# Patient Record
Sex: Female | Born: 1955 | Race: White | Hispanic: No | Marital: Single | Smoking: Former smoker
Health system: Southern US, Community
[De-identification: ages and names within clinical notes are randomized; demographics above are authoritative.]

## PROBLEM LIST (undated history)

## (undated) DIAGNOSIS — Z860100 Personal history of colon polyps, unspecified: Secondary | ICD-10-CM

## (undated) DIAGNOSIS — I10 Essential (primary) hypertension: Secondary | ICD-10-CM

## (undated) DIAGNOSIS — R945 Abnormal results of liver function studies: Secondary | ICD-10-CM

## (undated) DIAGNOSIS — Z87898 Personal history of other specified conditions: Secondary | ICD-10-CM

## (undated) DIAGNOSIS — Z853 Personal history of malignant neoplasm of breast: Secondary | ICD-10-CM

## (undated) HISTORY — DX: Personal history of other specified conditions: Z87.898

## (undated) HISTORY — DX: Personal history of colon polyps, unspecified: Z86.0100

## (undated) HISTORY — DX: Personal history of colonic polyps: Z86.010

## (undated) HISTORY — DX: Abnormal results of liver function studies: R94.5

## (undated) HISTORY — DX: Essential (primary) hypertension: I10

## (undated) HISTORY — DX: Personal history of malignant neoplasm of breast: Z85.3

## (undated) HISTORY — PX: BREAST LUMPECTOMY: SHX2

---

## 1986-11-14 HISTORY — PX: CERVICAL CONE BIOPSY: SUR198

## 2004-07-07 ENCOUNTER — Other Ambulatory Visit: Admission: RE | Admit: 2004-07-07 | Discharge: 2004-07-07 | Payer: Self-pay | Admitting: *Deleted

## 2004-10-26 ENCOUNTER — Encounter: Admission: RE | Admit: 2004-10-26 | Discharge: 2004-10-26 | Payer: Self-pay | Admitting: Family Medicine

## 2005-08-08 ENCOUNTER — Other Ambulatory Visit: Admission: RE | Admit: 2005-08-08 | Discharge: 2005-08-08 | Payer: Self-pay | Admitting: *Deleted

## 2005-10-27 ENCOUNTER — Encounter: Admission: RE | Admit: 2005-10-27 | Discharge: 2005-10-27 | Payer: Self-pay | Admitting: Family Medicine

## 2006-10-19 ENCOUNTER — Other Ambulatory Visit: Admission: RE | Admit: 2006-10-19 | Discharge: 2006-10-19 | Payer: Self-pay | Admitting: Obstetrics & Gynecology

## 2006-10-30 ENCOUNTER — Encounter: Admission: RE | Admit: 2006-10-30 | Discharge: 2006-10-30 | Payer: Self-pay | Admitting: Family Medicine

## 2006-10-30 IMAGING — MG MM DIGITAL SCREENING BILAT W/ CAD
4 series · 4 of 4 positions shown · non-contrast
Comparison: none

DG SCREEN MAMMOGRAM BILATERAL
Bilateral CC and MLO view(s) were taken.

DIGITAL SCREENING MAMMOGRAM WITH CAD:
The breast tissue is heterogeneously dense.  Microcalcifications are present in the left breast.  
Characterization with magnification views is recommended.  No mass or malignant type calcifications
are identified in the right breast.  Compared with prior studies.

[R CC]
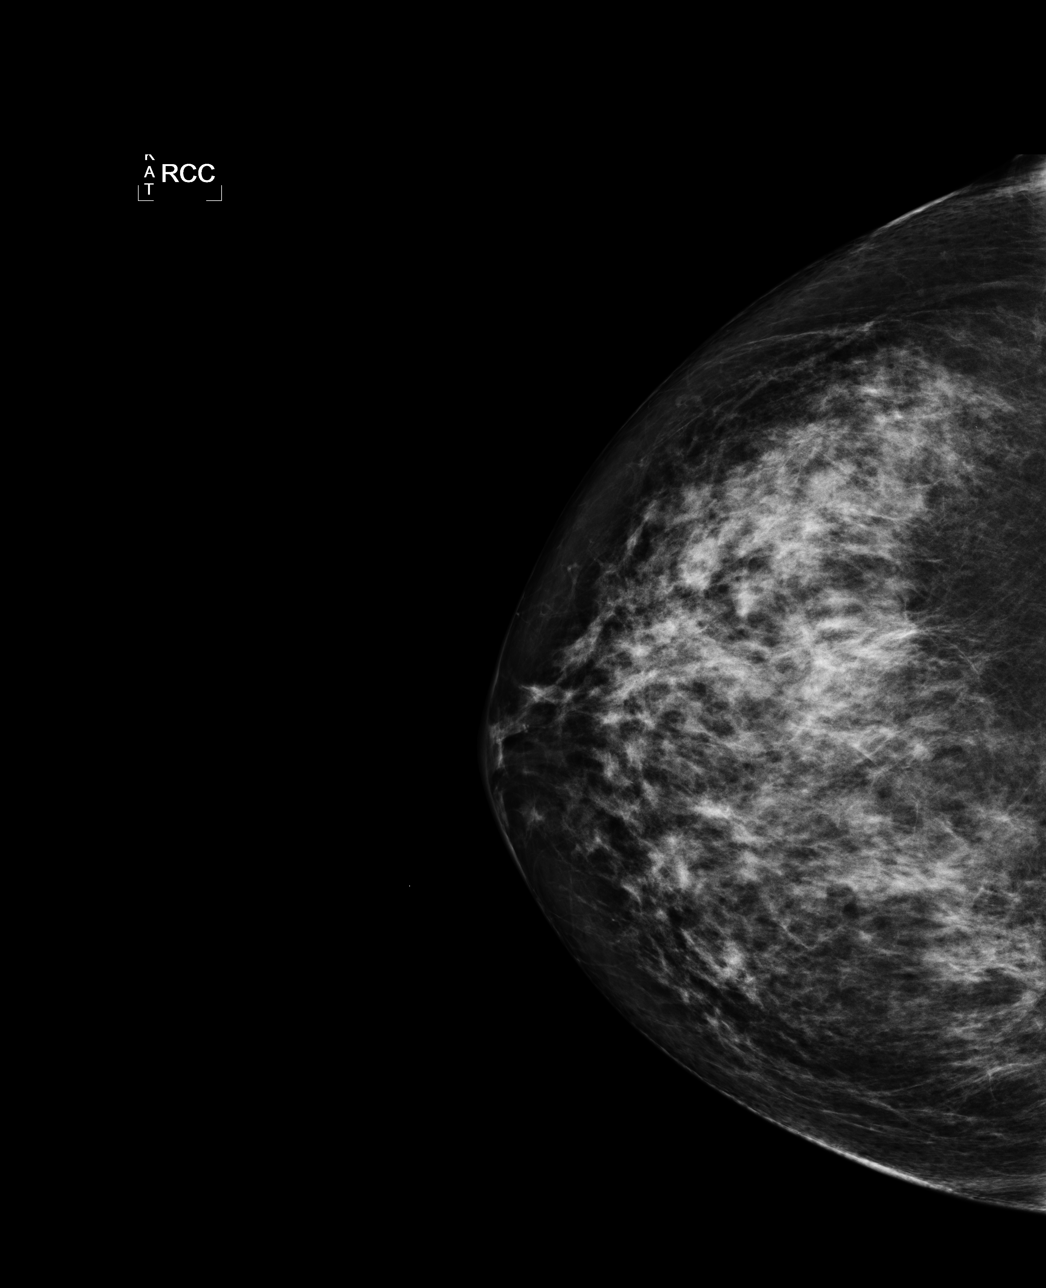

[L CC]
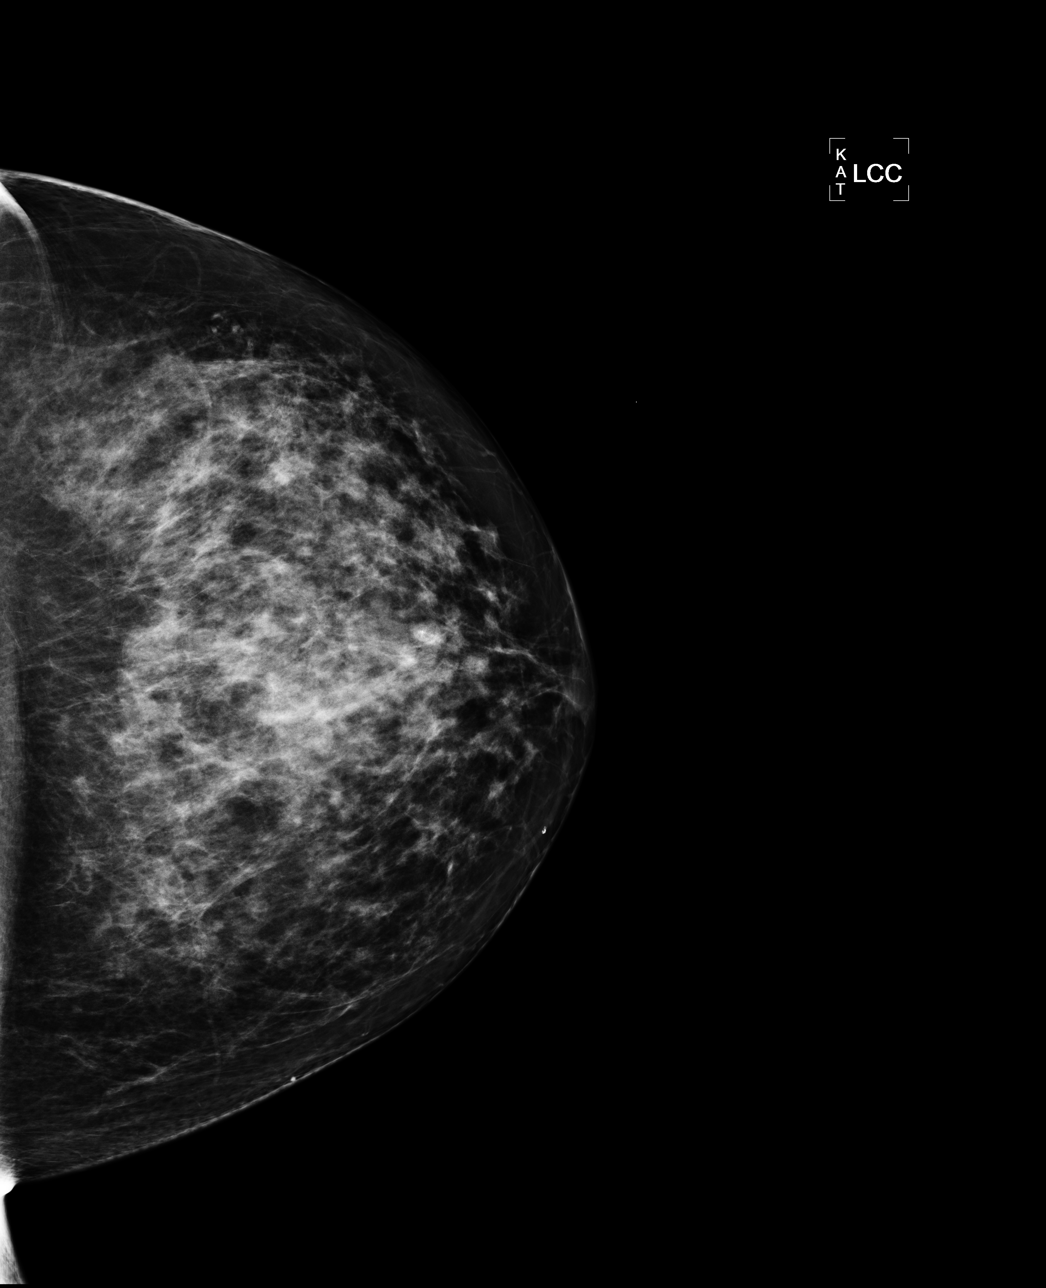

[L MLO]
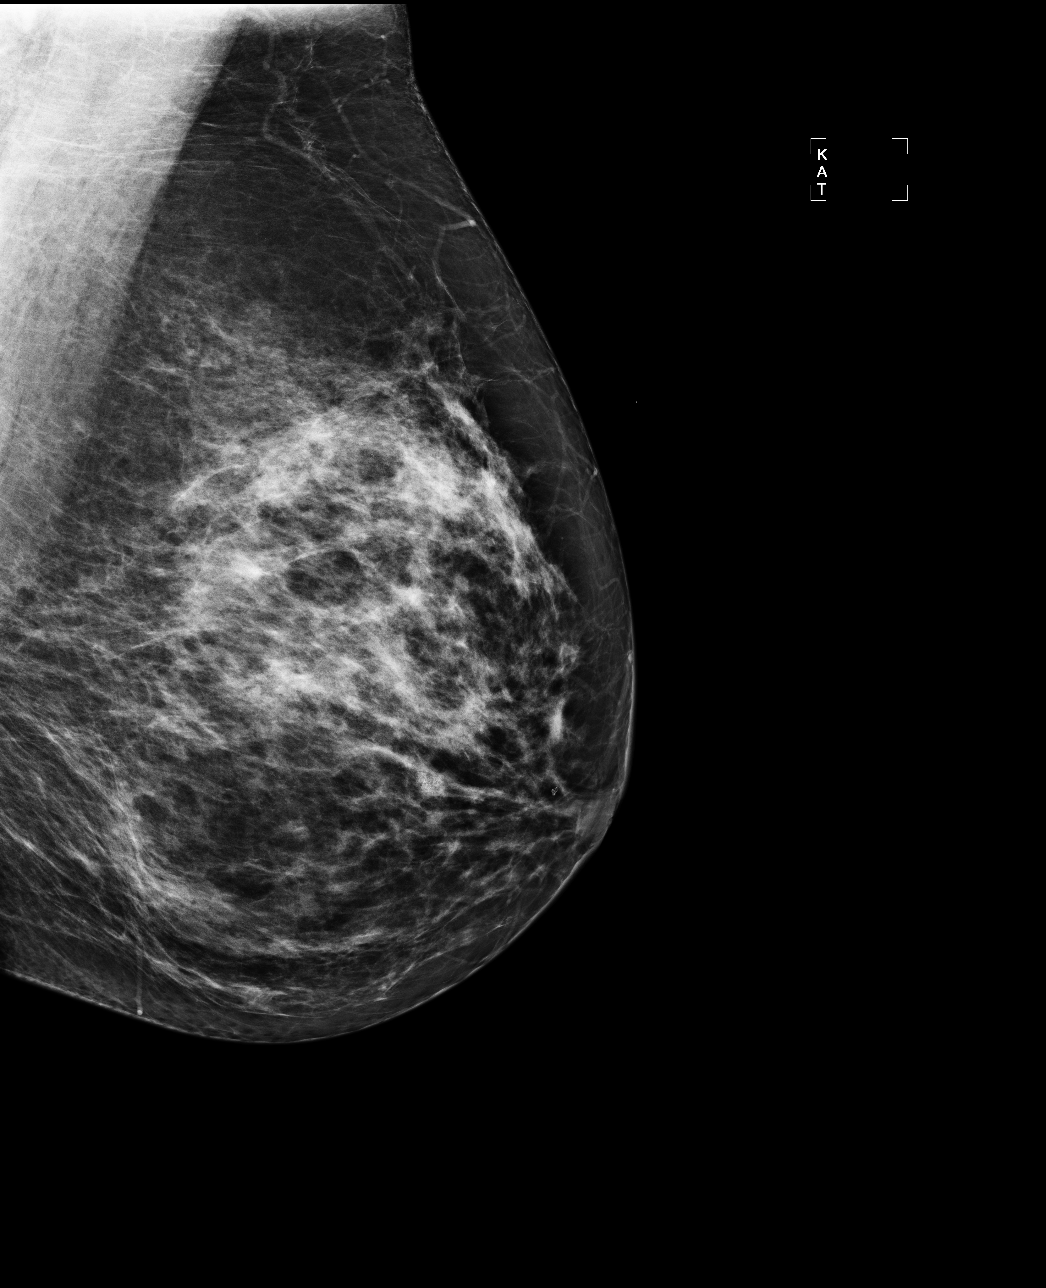

[R MLO]
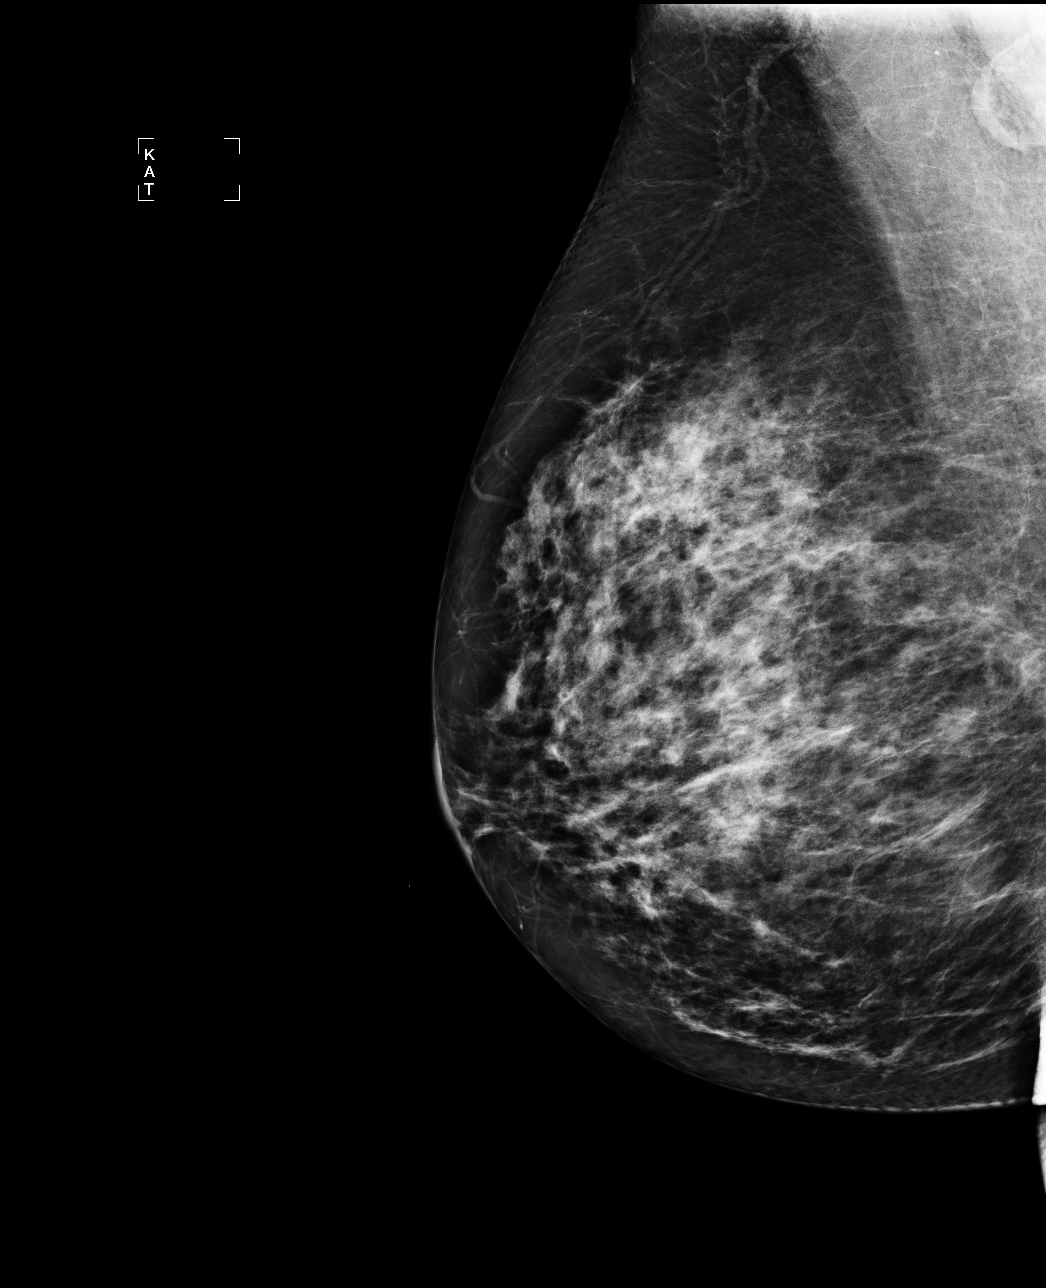

[4 of 4 positions shown; findings below may reference images not displayed]

IMPRESSION: Calcifications, left breast.  Additional evaluation is indicated. The patient will be contacted for
additional studies and a supplementary report will follow.  No specific mammographic evidence of 
malignancy, right breast.

ASSESSMENT: Need additional imaging evaluation and/or prior mammograms for comparison - BI-RADS 0

Further imaging of the left breast.
ANALYZED BY COMPUTER AIDED DETECTION. , THIS PROCEDURE WAS A DIGITAL MAMMOGRAM.

## 2006-11-10 ENCOUNTER — Encounter (INDEPENDENT_AMBULATORY_CARE_PROVIDER_SITE_OTHER): Payer: Self-pay | Admitting: Specialist

## 2006-11-10 ENCOUNTER — Encounter: Admission: RE | Admit: 2006-11-10 | Discharge: 2006-11-10 | Payer: Self-pay | Admitting: Family Medicine

## 2006-12-04 ENCOUNTER — Encounter: Admission: RE | Admit: 2006-12-04 | Discharge: 2006-12-04 | Payer: Self-pay | Admitting: Family Medicine

## 2007-02-22 ENCOUNTER — Ambulatory Visit: Payer: Self-pay | Admitting: Oncology

## 2007-02-28 LAB — CBC WITH DIFFERENTIAL/PLATELET
Basophils Absolute: 0 10*3/uL (ref 0.0–0.1)
EOS%: 1.6 % (ref 0.0–7.0)
HCT: 41.3 % (ref 34.8–46.6)
HGB: 14.7 g/dL (ref 11.6–15.9)
LYMPH%: 40.7 % (ref 14.0–48.0)
MCH: 31 pg (ref 26.0–34.0)
MCHC: 35.5 g/dL (ref 32.0–36.0)
MCV: 87.4 fL (ref 81.0–101.0)
MONO%: 7.2 % (ref 0.0–13.0)
NEUT%: 49.8 % (ref 39.6–76.8)
Platelets: 230 10*3/uL (ref 145–400)

## 2007-02-28 LAB — COMPREHENSIVE METABOLIC PANEL
AST: 22 U/L (ref 0–37)
Albumin: 4.2 g/dL (ref 3.5–5.2)
Alkaline Phosphatase: 76 U/L (ref 39–117)
Potassium: 3.8 mEq/L (ref 3.5–5.3)
Sodium: 142 mEq/L (ref 135–145)
Total Bilirubin: 0.6 mg/dL (ref 0.3–1.2)
Total Protein: 6.6 g/dL (ref 6.0–8.3)

## 2007-03-01 ENCOUNTER — Ambulatory Visit: Admission: RE | Admit: 2007-03-01 | Discharge: 2007-05-16 | Payer: Self-pay | Admitting: Radiation Oncology

## 2007-04-26 ENCOUNTER — Ambulatory Visit: Payer: Self-pay | Admitting: Oncology

## 2007-07-30 ENCOUNTER — Ambulatory Visit: Payer: Self-pay | Admitting: Oncology

## 2007-09-13 ENCOUNTER — Ambulatory Visit: Payer: Self-pay | Admitting: Internal Medicine

## 2007-09-14 ENCOUNTER — Ambulatory Visit: Payer: Self-pay | Admitting: Oncology

## 2007-09-18 LAB — CBC WITH DIFFERENTIAL/PLATELET
Basophils Absolute: 0 10*3/uL (ref 0.0–0.1)
EOS%: 1.8 % (ref 0.0–7.0)
LYMPH%: 37.9 % (ref 14.0–48.0)
MCH: 31.3 pg (ref 26.0–34.0)
MCV: 86.8 fL (ref 81.0–101.0)
MONO%: 9.8 % (ref 0.0–13.0)
Platelets: 191 10*3/uL (ref 145–400)
RBC: 4.67 10*6/uL (ref 3.70–5.32)
RDW: 12.4 % (ref 11.3–14.5)

## 2007-09-27 LAB — ESTRADIOL, ULTRA SENS: Estradiol, Ultra Sensitive: 19 pg/mL

## 2007-09-28 ENCOUNTER — Ambulatory Visit: Payer: Self-pay | Admitting: Internal Medicine

## 2007-11-12 ENCOUNTER — Encounter: Admission: RE | Admit: 2007-11-12 | Discharge: 2007-11-12 | Payer: Self-pay | Admitting: Oncology

## 2007-12-20 ENCOUNTER — Other Ambulatory Visit: Admission: RE | Admit: 2007-12-20 | Discharge: 2007-12-20 | Payer: Self-pay | Admitting: Obstetrics & Gynecology

## 2007-12-25 ENCOUNTER — Ambulatory Visit: Payer: Self-pay | Admitting: Oncology

## 2008-01-03 ENCOUNTER — Encounter: Admission: RE | Admit: 2008-01-03 | Discharge: 2008-01-03 | Payer: Self-pay | Admitting: Obstetrics & Gynecology

## 2008-02-04 LAB — CBC WITH DIFFERENTIAL/PLATELET
BASO%: 1.3 % (ref 0.0–2.0)
EOS%: 1.6 % (ref 0.0–7.0)
HCT: 40.4 % (ref 34.8–46.6)
LYMPH%: 33.9 % (ref 14.0–48.0)
MCH: 31.3 pg (ref 26.0–34.0)
MCHC: 36.1 g/dL — ABNORMAL HIGH (ref 32.0–36.0)
MONO#: 0.3 10*3/uL (ref 0.1–0.9)
NEUT%: 57.2 % (ref 39.6–76.8)
RBC: 4.66 10*6/uL (ref 3.70–5.32)
WBC: 5.1 10*3/uL (ref 3.9–10.0)
lymph#: 1.7 10*3/uL (ref 0.9–3.3)

## 2008-02-04 LAB — COMPREHENSIVE METABOLIC PANEL
ALT: 26 U/L (ref 0–35)
AST: 23 U/L (ref 0–37)
Creatinine, Ser: 0.9 mg/dL (ref 0.40–1.20)
Sodium: 144 mEq/L (ref 135–145)
Total Bilirubin: 0.4 mg/dL (ref 0.3–1.2)
Total Protein: 6.6 g/dL (ref 6.0–8.3)

## 2008-03-04 ENCOUNTER — Encounter: Payer: Self-pay | Admitting: Internal Medicine

## 2008-08-19 ENCOUNTER — Ambulatory Visit: Payer: Self-pay | Admitting: Oncology

## 2008-08-22 LAB — CBC WITH DIFFERENTIAL/PLATELET
Basophils Absolute: 0 10*3/uL (ref 0.0–0.1)
EOS%: 1.9 % (ref 0.0–7.0)
Eosinophils Absolute: 0.1 10*3/uL (ref 0.0–0.5)
HCT: 41.1 % (ref 34.8–46.6)
HGB: 14.4 g/dL (ref 11.6–15.9)
MCH: 31.2 pg (ref 26.0–34.0)
MCV: 88.8 fL (ref 81.0–101.0)
NEUT#: 2.6 10*3/uL (ref 1.5–6.5)
NEUT%: 53.7 % (ref 39.6–76.8)
lymph#: 1.8 10*3/uL (ref 0.9–3.3)

## 2008-08-22 LAB — COMPREHENSIVE METABOLIC PANEL
ALT: 35 U/L (ref 0–35)
Albumin: 4 g/dL (ref 3.5–5.2)
CO2: 24 mEq/L (ref 19–32)
Chloride: 105 mEq/L (ref 96–112)
Glucose, Bld: 125 mg/dL — ABNORMAL HIGH (ref 70–99)
Potassium: 3.3 mEq/L — ABNORMAL LOW (ref 3.5–5.3)
Sodium: 141 mEq/L (ref 135–145)
Total Bilirubin: 0.3 mg/dL (ref 0.3–1.2)
Total Protein: 6.5 g/dL (ref 6.0–8.3)

## 2008-08-22 LAB — CANCER ANTIGEN 27.29: CA 27.29: 23 U/mL (ref 0–39)

## 2008-10-31 ENCOUNTER — Ambulatory Visit: Payer: Self-pay | Admitting: Oncology

## 2008-11-04 LAB — COMPREHENSIVE METABOLIC PANEL
AST: 36 U/L (ref 0–37)
Alkaline Phosphatase: 61 U/L (ref 39–117)
BUN: 17 mg/dL (ref 6–23)
Calcium: 9 mg/dL (ref 8.4–10.5)
Creatinine, Ser: 0.88 mg/dL (ref 0.40–1.20)
Glucose, Bld: 87 mg/dL (ref 70–99)

## 2008-11-14 DIAGNOSIS — R945 Abnormal results of liver function studies: Secondary | ICD-10-CM

## 2008-11-14 DIAGNOSIS — R7989 Other specified abnormal findings of blood chemistry: Secondary | ICD-10-CM

## 2008-11-14 HISTORY — DX: Abnormal results of liver function studies: R94.5

## 2008-11-14 HISTORY — DX: Other specified abnormal findings of blood chemistry: R79.89

## 2008-11-17 ENCOUNTER — Encounter: Admission: RE | Admit: 2008-11-17 | Discharge: 2008-11-17 | Payer: Self-pay | Admitting: Oncology

## 2008-12-03 ENCOUNTER — Ambulatory Visit: Payer: Self-pay | Admitting: Internal Medicine

## 2008-12-03 ENCOUNTER — Ambulatory Visit: Payer: Self-pay

## 2008-12-03 DIAGNOSIS — R0989 Other specified symptoms and signs involving the circulatory and respiratory systems: Secondary | ICD-10-CM

## 2008-12-03 DIAGNOSIS — I10 Essential (primary) hypertension: Secondary | ICD-10-CM

## 2008-12-03 DIAGNOSIS — Z860100 Personal history of colon polyps, unspecified: Secondary | ICD-10-CM | POA: Insufficient documentation

## 2008-12-03 DIAGNOSIS — R609 Edema, unspecified: Secondary | ICD-10-CM | POA: Insufficient documentation

## 2008-12-03 DIAGNOSIS — R12 Heartburn: Secondary | ICD-10-CM | POA: Insufficient documentation

## 2008-12-03 DIAGNOSIS — R0609 Other forms of dyspnea: Secondary | ICD-10-CM | POA: Insufficient documentation

## 2008-12-03 DIAGNOSIS — J309 Allergic rhinitis, unspecified: Secondary | ICD-10-CM | POA: Insufficient documentation

## 2008-12-03 HISTORY — DX: Essential (primary) hypertension: I10

## 2008-12-04 LAB — CONVERTED CEMR LAB: Troponin I: <0.01 ng/mL (ref <0.06)

## 2008-12-10 ENCOUNTER — Encounter: Admission: RE | Admit: 2008-12-10 | Discharge: 2008-12-10 | Payer: Self-pay | Admitting: Oncology

## 2008-12-12 ENCOUNTER — Telehealth: Payer: Self-pay | Admitting: Internal Medicine

## 2008-12-22 ENCOUNTER — Telehealth: Payer: Self-pay | Admitting: *Deleted

## 2008-12-22 ENCOUNTER — Other Ambulatory Visit: Admission: RE | Admit: 2008-12-22 | Discharge: 2008-12-22 | Payer: Self-pay | Admitting: Obstetrics and Gynecology

## 2008-12-23 ENCOUNTER — Ambulatory Visit: Payer: Self-pay

## 2008-12-23 ENCOUNTER — Encounter: Payer: Self-pay | Admitting: Internal Medicine

## 2009-01-01 ENCOUNTER — Telehealth: Payer: Self-pay | Admitting: Internal Medicine

## 2009-04-06 ENCOUNTER — Telehealth: Payer: Self-pay | Admitting: *Deleted

## 2009-05-11 ENCOUNTER — Telehealth: Payer: Self-pay | Admitting: *Deleted

## 2009-05-29 ENCOUNTER — Ambulatory Visit: Payer: Self-pay | Admitting: Internal Medicine

## 2009-05-29 DIAGNOSIS — F4323 Adjustment disorder with mixed anxiety and depressed mood: Secondary | ICD-10-CM | POA: Insufficient documentation

## 2009-05-29 LAB — CONVERTED CEMR LAB: Ketones, urine, test strip: NEGATIVE

## 2009-06-05 LAB — CONVERTED CEMR LAB
AST: 40 units/L — ABNORMAL HIGH (ref 0–37)
Albumin: 4 g/dL (ref 3.5–5.2)
Chloride: 105 meq/L (ref 96–112)
Creatinine, Ser: 0.91 mg/dL (ref 0.40–1.20)
Glucose, Bld: 82 mg/dL (ref 70–99)
Indirect Bilirubin: 0.3 mg/dL (ref 0.0–0.9)
Magnesium: 1.9 mg/dL (ref 1.5–2.5)
Sodium: 143 meq/L (ref 135–145)
T3, Free: 3 pg/mL (ref 2.3–4.2)
Total Protein: 7 g/dL (ref 6.0–8.3)

## 2009-07-10 ENCOUNTER — Ambulatory Visit: Payer: Self-pay | Admitting: Internal Medicine

## 2009-07-10 LAB — CONVERTED CEMR LAB
Hep A IgM: NEGATIVE
Hep B Core Total Ab: NEGATIVE
Hepatitis B Surface Ag: NEGATIVE

## 2009-07-14 ENCOUNTER — Encounter: Payer: Self-pay | Admitting: Internal Medicine

## 2009-07-17 LAB — CONVERTED CEMR LAB
ALT: 69 units/L — ABNORMAL HIGH (ref 0–35)
Albumin: 3.5 g/dL (ref 3.5–5.2)
Bilirubin, Direct: 0 mg/dL (ref 0.0–0.3)
Ferritin: 35.1 ng/mL (ref 10.0–291.0)

## 2009-08-17 ENCOUNTER — Ambulatory Visit: Payer: Self-pay | Admitting: Internal Medicine

## 2009-08-17 LAB — CONVERTED CEMR LAB
Alkaline Phosphatase: 85 units/L (ref 39–117)
Total Protein: 6.7 g/dL (ref 6.0–8.3)

## 2009-08-18 ENCOUNTER — Ambulatory Visit: Payer: Self-pay | Admitting: Oncology

## 2009-08-20 LAB — COMPREHENSIVE METABOLIC PANEL
Albumin: 4.2 g/dL (ref 3.5–5.2)
CO2: 24 mEq/L (ref 19–32)
Chloride: 106 mEq/L (ref 96–112)
Glucose, Bld: 86 mg/dL (ref 70–99)
Potassium: 4 mEq/L (ref 3.5–5.3)
Sodium: 141 mEq/L (ref 135–145)
Total Protein: 6.7 g/dL (ref 6.0–8.3)

## 2009-08-20 LAB — CBC WITH DIFFERENTIAL/PLATELET
Eosinophils Absolute: 0.1 10*3/uL (ref 0.0–0.5)
MONO#: 0.5 10*3/uL (ref 0.1–0.9)
NEUT#: 2.5 10*3/uL (ref 1.5–6.5)
Platelets: 192 10*3/uL (ref 145–400)
RBC: 4.72 10*6/uL (ref 3.70–5.45)
RDW: 13 % (ref 11.2–14.5)
WBC: 5.4 10*3/uL (ref 3.9–10.3)
lymph#: 2.2 10*3/uL (ref 0.9–3.3)

## 2009-08-20 LAB — FOLLICLE STIMULATING HORMONE: FSH: 68.1 m[IU]/mL

## 2009-08-24 ENCOUNTER — Ambulatory Visit: Payer: Self-pay | Admitting: Internal Medicine

## 2009-08-24 DIAGNOSIS — Z87891 Personal history of nicotine dependence: Secondary | ICD-10-CM | POA: Insufficient documentation

## 2009-08-27 ENCOUNTER — Encounter: Payer: Self-pay | Admitting: Internal Medicine

## 2009-09-03 ENCOUNTER — Ambulatory Visit: Payer: Self-pay | Admitting: Internal Medicine

## 2009-09-03 LAB — ESTRADIOL, ULTRA SENS

## 2009-09-09 ENCOUNTER — Encounter: Admission: RE | Admit: 2009-09-09 | Discharge: 2009-09-09 | Payer: Self-pay | Admitting: Oncology

## 2009-09-28 ENCOUNTER — Ambulatory Visit: Payer: Self-pay | Admitting: Internal Medicine

## 2009-11-04 ENCOUNTER — Ambulatory Visit: Payer: Self-pay | Admitting: Internal Medicine

## 2010-01-05 ENCOUNTER — Ambulatory Visit: Payer: Self-pay | Admitting: Internal Medicine

## 2010-01-18 ENCOUNTER — Ambulatory Visit: Payer: Self-pay | Admitting: Internal Medicine

## 2010-01-19 ENCOUNTER — Encounter: Admission: RE | Admit: 2010-01-19 | Discharge: 2010-01-19 | Payer: Self-pay | Admitting: Oncology

## 2010-01-19 LAB — HM MAMMOGRAPHY

## 2010-02-02 LAB — CONVERTED CEMR LAB
ALT: 36 units/L — ABNORMAL HIGH (ref 0–35)
Albumin: 3.6 g/dL (ref 3.5–5.2)
Alkaline Phosphatase: 73 units/L (ref 39–117)

## 2010-05-20 ENCOUNTER — Telehealth: Payer: Self-pay | Admitting: Internal Medicine

## 2010-09-07 ENCOUNTER — Ambulatory Visit: Payer: Self-pay | Admitting: Oncology

## 2010-09-08 ENCOUNTER — Ambulatory Visit: Payer: Self-pay | Admitting: Internal Medicine

## 2010-09-08 LAB — CONVERTED CEMR LAB
ALT: 32 units/L (ref 0–35)
AST: 31 units/L (ref 0–37)
Alkaline Phosphatase: 77 units/L (ref 39–117)
BUN: 14 mg/dL (ref 6–23)
CO2: 28 meq/L (ref 19–32)
Cholesterol: 184 mg/dL (ref 0–200)
Creatinine, Ser: 0.9 mg/dL (ref 0.4–1.2)
Glucose, Urine, Semiquant: NEGATIVE
HDL: 44.2 mg/dL (ref >39.00)
LDL Cholesterol: 116 mg/dL — ABNORMAL HIGH (ref 0–99)
Lymphocytes Relative: 39.1 % (ref 12.0–46.0)
MCHC: 34 g/dL (ref 30.0–36.0)
MCV: 82.2 fL (ref 78.0–100.0)
Platelets: 215 10*3/uL (ref 150.0–400.0)
Protein, U semiquant: NEGATIVE
RBC: 4.22 M/uL (ref 3.87–5.11)
RDW: 14.1 % (ref 11.5–14.6)
Sodium: 138 meq/L (ref 135–145)
Specific Gravity, Urine: 1.015
TSH: 4.28 microintl units/mL (ref 0.35–5.50)
Total Bilirubin: 0.4 mg/dL (ref 0.3–1.2)
Total CHOL/HDL Ratio: 4
Triglycerides: 118 mg/dL (ref 0.0–149.0)
pH: 5.5

## 2010-09-09 LAB — CBC WITH DIFFERENTIAL/PLATELET
BASO%: 0.7 % (ref 0.0–2.0)
Basophils Absolute: 0 10*3/uL (ref 0.0–0.1)
EOS%: 2.2 % (ref 0.0–7.0)
Eosinophils Absolute: 0.1 10*3/uL (ref 0.0–0.5)
MCV: 81.5 fL (ref 79.5–101.0)
MONO#: 0.5 10*3/uL (ref 0.1–0.9)
NEUT#: 3 10*3/uL (ref 1.5–6.5)
Platelets: 241 10*3/uL (ref 145–400)
WBC: 6 10*3/uL (ref 3.9–10.3)
lymph#: 2.4 10*3/uL (ref 0.9–3.3)

## 2010-09-09 LAB — COMPREHENSIVE METABOLIC PANEL
ALT: 34 U/L (ref 0–35)
AST: 31 U/L (ref 0–37)
BUN: 18 mg/dL (ref 6–23)
CO2: 28 mEq/L (ref 19–32)
Glucose, Bld: 96 mg/dL (ref 70–99)
Potassium: 3.7 mEq/L (ref 3.5–5.3)

## 2010-09-10 LAB — CANCER ANTIGEN 27.29: CA 27.29: 18 U/mL (ref 0–39)

## 2010-09-14 ENCOUNTER — Other Ambulatory Visit: Admission: RE | Admit: 2010-09-14 | Discharge: 2010-09-14 | Payer: Self-pay | Admitting: Internal Medicine

## 2010-09-14 ENCOUNTER — Ambulatory Visit: Payer: Self-pay | Admitting: Internal Medicine

## 2010-09-14 DIAGNOSIS — D649 Anemia, unspecified: Secondary | ICD-10-CM | POA: Insufficient documentation

## 2010-09-14 DIAGNOSIS — R3915 Urgency of urination: Secondary | ICD-10-CM | POA: Insufficient documentation

## 2010-09-14 DIAGNOSIS — E669 Obesity, unspecified: Secondary | ICD-10-CM | POA: Insufficient documentation

## 2010-09-14 LAB — HM PAP SMEAR

## 2010-09-14 LAB — CONVERTED CEMR LAB
Glucose, Urine, Semiquant: NEGATIVE
Specific Gravity, Urine: <1.005
Urobilinogen, UA: 0.2
WBC Urine, dipstick: NEGATIVE

## 2010-10-08 ENCOUNTER — Ambulatory Visit: Payer: Self-pay | Admitting: Oncology

## 2010-10-11 ENCOUNTER — Encounter: Payer: Self-pay | Admitting: Internal Medicine

## 2010-10-26 ENCOUNTER — Telehealth: Payer: Self-pay | Admitting: Internal Medicine

## 2010-11-19 ENCOUNTER — Ambulatory Visit
Admission: RE | Admit: 2010-11-19 | Discharge: 2010-11-19 | Payer: Self-pay | Source: Home / Self Care | Attending: Internal Medicine | Admitting: Internal Medicine

## 2010-11-19 ENCOUNTER — Other Ambulatory Visit: Payer: Self-pay | Admitting: Internal Medicine

## 2010-11-19 LAB — HEMOGLOBIN: Hemoglobin: 12.5 g/dL (ref 12.0–15.0)

## 2010-12-04 ENCOUNTER — Other Ambulatory Visit: Payer: Self-pay | Admitting: Oncology

## 2010-12-04 DIAGNOSIS — Z9889 Other specified postprocedural states: Secondary | ICD-10-CM

## 2010-12-05 ENCOUNTER — Encounter: Payer: Self-pay | Admitting: Oncology

## 2010-12-12 LAB — CONVERTED CEMR LAB
BUN: 15 mg/dL (ref 6–23)
Basophils Absolute: 0 10*3/uL (ref 0.0–0.1)
Basophils Relative: 0.9 % (ref 0.0–3.0)
CO2: 32 meq/L (ref 19–32)
Calcium: 9.1 mg/dL (ref 8.4–10.5)
Creatinine, Ser: 0.9 mg/dL (ref 0.4–1.2)
Glucose, Bld: 101 mg/dL — ABNORMAL HIGH (ref 70–99)
HCT: 41.6 % (ref 36.0–46.0)
MCV: 89.6 fL (ref 78.0–100.0)
Monocytes Absolute: 0.4 10*3/uL (ref 0.1–1.0)
Neutro Abs: 2.5 10*3/uL (ref 1.4–7.7)
Pap Smear: NORMAL
Sodium: 144 meq/L (ref 135–145)

## 2010-12-13 ENCOUNTER — Ambulatory Visit
Admission: RE | Admit: 2010-12-13 | Discharge: 2010-12-13 | Payer: Self-pay | Source: Home / Self Care | Attending: Internal Medicine | Admitting: Internal Medicine

## 2010-12-15 NOTE — Progress Notes (Signed)
Summary: Pt req to get Lexapro 10mg  [facility] [address] [address]  Phone Note Refill Request   Refills Requested: Medication #1:  LEXAPRO 10 MG TABS 1 by mouth once daily   Dosage confirmed as above?Dosage Confirmed Pt has the mail order script for [facility] for Lexapro, but she would [facility] get this script [facility] [address] [facility] [address]   Method Requested: Fax to Local Pharmacy Initial call taken by: Lucy Antigua,  May 20, 2010 11:44 AM    Prescriptions: LEXAPRO 10 MG TABS (ESCITALOPRAM OXALATE) 1 by mouth once daily  #90 x 1   Entered by:   Raechel Ache, RN   Authorized by:   Madelin Headings MD   Signed by:   Raechel Ache, RN on 05/20/2010   Method used:   Electronically to        [facility]  [address] [id number]       [address]       [place], [place]  [address]       Ph: [phone]       Fax: [phone]   RxID:   [id number]

## 2010-12-15 NOTE — Assessment & Plan Note (Signed)
Summary: CPX W/PAP/RCD/PT RSC/CJR   Vital Signs:  Patient profile:   55 year old female Menstrual status:  postmenopausal Height:      65.25 inches Weight:      212 pounds BMI:     35.14 Pulse rate:   72 / minute BP sitting:   110 / 80  (left arm) Cuff size:   regular  Vitals Entered By: Romualdo Bolk, CMA (AAMA) (September 14, 2010 1:56 PM) CC: CPX with pap.   History of Present Illness: Amy Orozco comes in today  for   preventive visit and check up and to follow up of her multiple medical conditions .  BP No new se of meds . Has gained weight . NO cp sob .  Hx breast cancer : To see Dr Darnelle Catalan soon .   GU:  Wears a pad with urinary urgency    and dash to bathroom.   no dysuria or uti   symptoms  Weight: has gained soome under a lot of stress .  Began   weight watchers   again. Knows what to do.   Mood: Lexapro :   ? help ? if not as effective  . No specific se.   Preventive Care Screening  Prior Values:    Pap Smear:  Normal (12/16/2007)    Mammogram:  BI-RADS CATEGORY 2:  Benign finding(s).^MM DIGITAL DIAGNOSTIC BILAT (01/19/2010)    Colonoscopy:  Normal (11/15/2007)    Bone Density:  Normal (11/14/2006)    Dexa Interp:  Normal (11/14/2006)   Preventive Screening-Counseling & Management  Alcohol-Tobacco     Alcohol drinks/day: 0     Smoking Status: quit     Year Quit: age 68  Caffeine-Diet-Exercise     Caffeine use/day: 3     Does Patient Exercise: yes  Hep-HIV-STD-Contraception     Dental Visit-last 6 months yes     Sun Exposure-Excessive: no  Safety-Violence-Falls     Seat Belt Use: yes     Firearms in the Home: no firearms in the home     Smoke Detectors: yes     Fall Risk: no  Current Medications (verified): 1)  Lexapro 10 Mg Tabs (Escitalopram Oxalate) .Marland Kitchen.. 1 By Mouth Once Daily 2)  Ziac 5-6.25 Mg Tabs (Bisoprolol-Hydrochlorothiazide) .Marland Kitchen.. 1 By Mouth Once Daily For High Blood Pressure 3)  Potassium Chloride Crys Cr 10 Meq Cr-Tabs (Potassium  Chloride Crys Cr) .Marland Kitchen.. 1 By Mouth Once Daily  Allergies (verified): 1)  ! Penicillin 2)  ! Sulfa 3)  ! Codeine 4)  ! * Shellfish  Past History:  Past medical, surgical, family and social histories (including risk factors) reviewed, and no changes noted (except as noted below).  Past Medical History: Reviewed history from 01/18/2010 and no changes required. Hypertension  about 10 years  Breast cancer, hx of 2008?    in situ  left   on mri q 2 years.  Pt was on Tamoxfen d/c this on 11/09. Abnormal LFTS MRI fatty liver and hemangioma 2010 Consults Dr. Priscille Heidelberg Dr. Gwendolyn Fill [facility]       LAST Mammogram: 11/17/2008 Pap: 12/2007  abnormal in 1988.  Td: More than 10 years Colonscopy: 2009 remote hx of abnormal pap and had cone biopsy.  and was ok.  EKG:  unsure Dexa: 11/2007 Eye Exam: 11/2008 Other: Smoking: Former Colonic polyps, hx of Allergic rhinitis  Past Surgical History: Reviewed history from 12/03/2008 and no changes required. Lumpectomy- Left Breast Cone Bx- 1988 for abnormal pap  Past History:  Care Management: Gynecology: Hyacinth Meeker Gastroenterology: Corinda G[facility]ler [facility] Hematology/Oncology: Magaran[facility]  Family History: Reviewed history from 09/28/2009 and no changes required. Father: Heart attack, stroke Mother: asthma, degentive joint disease, thyroid problems, heart disease,  pcirculatory problems phlebitis  no DVT. Siblings: All Healthy   mom OA , aunt RA.   Social History: Reviewed history from 09/28/2009 and no changes required. 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         urinary urgency   and ocass leaks   Physical Exam  General:  Well-developed,well-nourished,in no acute distress; alert,appropriate and cooperative throughout examination Head:  normocephalic and atraumatic.   Eyes:  PERRL, EOMs full, conjunctiva clear  Ears:  R ear normal, L ear normal, and no external deformities.   Nose:  no external deformity, no external erythema, and no nasal discharge.   Mouth:  pharynx pink and moist.   Neck:  No deformities, masses, or tenderness noted. Breasts:  right  nl left inverted nipple no  masses  or tenderness Lungs:  Normal respiratory effort, chest expands symmetrically. Lungs are clear to auscultation, no crackles or wheezes. Heart:  Normal rate and regular rhythm. S1 and S2 normal without gallop, murmur, click, rub or other extra sounds. Abdomen:  Bowel sounds positive,abdomen soft and non-tender without masses, organomegaly or hernias noted. Rectal:  No external abnormalities noted. Normal sphincter tone. No rectal masses or tenderness. Genitalia:  Pelvic Exam:  no sig prolapse        External: normal female genitalia without lesions or masses        Vagina: normal without lesions or masses        Cervix: normal without lesions or masses        Adnexa: normal bimanual exam without masses or fullness  exam limited by large abdominal wall         Uterus: normal by palpation        Pap smear: performed Msk:  no joint warmth and no redness over joints.   Pulses:  pulses intact without delay   Extremities:  no clubbing cyanosis or edema  Neurologic:  alert & oriented X3, strength normal in all extremities, and gait normal.   Skin:  turgor normal, color normal, no ecchymoses,  no petechiae, and no purpura.   Cervical Nodes:  No lymphadenopathy noted Axillary Nodes:  No palpable lymphadenopathy Inguinal Nodes:  No significant adenopathy Psych:  Oriented X3, memory intact for recent and remote, good eye contact, not anxious appearing, and not depressed appearing.     Impression & Recommendations:  Problem # 1:  PREVENTIVE HEALTH CARE (ICD-V70.0) Discussed nutrition,exercise,diet,healthy weight, vitamin D and calcium.       Problem # 2:  Gynecological examination-routine (ICD-V72.31) PAP done   Problem # 3:  ANEMIA, MILD (ICD-285.9) ?  cause  menopausal  probably from    donating blood  about  every 3  months   last x 3 .  No active bleeding  Problem # 4:  HYPERTENSION (ICD-401.9)  Her updated medication list for this problem includes:    Ziac 5-6.25 Mg Tabs (Bisoprolol-hydrochlorothiazide) .Marland Kitchen... 1 by mouth once daily for high blood pressure  BP today: 110/80 Prior BP: 110/70 (01/18/2010)  Prior 10 Yr Risk Heart Disease: Not enough information (08/24/2009)  Labs Reviewed: K+: 4.2 (09/08/2010) Creat: : 0.9 (09/08/2010)   Chol: 184 (09/08/2010)   HDL: 44.20 (09/08/2010)   LDL: 116 (09/08/2010)   TG: 118.0 (09/08/2010)  Problem # 5:  BREAST CANCER, HX OF LEFT (ICD-V10.3) Assessment: Comment Only 2008  Problem # 6:  URINARY URGENCY (ZOX-096.04) avoid carbonation and caffiene   kegels anmd consider meds if needed .  Problem # 7:  OBESITY (ICD-278.00) agree with weight watchers.  Ht: 65.25 (09/14/2010)   Wt: 212 (09/14/2010)   BMI: 35.14 (09/14/2010)  Problem # 8:  ADJ DISORDER WITH MIXED ANXIETY & DEPRESSED MOOD (ICD-309.28) some slight backsliding could  be externally related but may benefit from a boost of meds.   Complete Medication List: 1)  Lexapro 10 Mg Tabs (Escitalopram oxalate) .... 2 by mouth once daily 2)  Ziac 5-6.25 Mg Tabs (Bisoprolol-hydrochlorothiazide) .Marland Kitchen.. 1 by mouth once daily for high blood pressure 3)  Potassium Chloride Crys  Cr 10 Meq Cr-tabs (Potassium chloride crys cr) .Marland Kitchen.. 1 by mouth once daily  Other Orders: Tdap => 79yrs IM (54098) Admin 1st Vaccine (11914) Pap Smear, Thin Prep ( Collection of) (N8295)  Patient Instructions: 1)  Iincrease to 20 mg per day lexapro. as a trial  2)  rov in 3-4 weeks  or call then .  and then decide on dosing and follow up  3)  Weight  loss  is recommended . 4)  Dont donate blood next cycle and then we should repeat  your Hg   after  ROV  2 months and if ok then   as needed . 5)  limit caffiene and carbonation  and wil reassess at follow up  Prescriptions: POTASSIUM CHLORIDE CRYS CR 10 MEQ CR-TABS (POTASSIUM CHLORIDE CRYS CR) 1 by mouth once daily  #90 x 3   Entered and Authorized by:   Madelin Headings MD   Signed by:   Madelin Headings MD on 09/14/2010   Method used:   [facility] to        [facility]  [address] [id number]       [address]       [place], [place]  [address]       Ph: [phone]       Fax: [phone]   RxID:   [id number] ZIAC 5-6.25 MG TABS (BISOPROLOL-HYDROCHLOROTHIAZIDE) 1 by mouth once daily for high blood pressure  #90 x 3   Entered and Authorized by:   Madelin Headings MD   Signed by:   Madelin Headings MD on 09/14/2010   Method used:   Electronically to        [facility]  [address] [id number]       [address]       [place], [place]  [address]       Ph: [phone]       Fax: [phone]   RxID:   [id number]    Orders Added: 1)  Tdap => 67yrs IM [06237] 2)  Admin 1st Vaccine [90471] 3)  Est. Patient 40-64 years [99396] 4)  Pap Smear, Thin Prep ( Collection of) [Q0091] 5)  Est. Patient Level III [84696]   Immunizations Administered:  Tetanus Vaccine:    Vaccine Type: Tdap    Site: right deltoid    Mfr: GlaxoSmithKline    Dose: 0.5 ml    Route: IM    Given by: Romualdo Bolk, CMA (AAMA)    Exp. Date: 09/02/2012    Lot #: [id number]   Immunizations  Administered:  Tetanus Vaccine:    Vaccine Type: Tdap    Site: right deltoid    Mfr: GlaxoSmithKline    Dose: 0.5 ml    Route: IM    Given by: Romualdo Bolk, CMA (AAMA)    Exp. Date: 09/02/2012    Lot #: [id number]   Laboratory Results   Urine Tests    Routine Urinalysis   Color: yellow Appearance: Clear Glucose: negative   (Normal Range: Negative) Bilirubin: negative   (Normal Range: Negative) Ketone: negative   (Normal Range: Negative) Spec. Gravity: <1.005   (Normal Range: 1.003-1.035) Blood: negative   (Normal Range: Negative) pH: 5.0   (Normal Range: 5.0-8.0) Protein: negative   (Normal Range: Negative) Urobilinogen: 0.2   (Normal Range: 0-1) Nitrite: negative   (Normal Range: Negative) Leukocyte Esterace: negative   (Normal Range: Negative)    Comments: Rita Ohara  September 14, 2010 3:39 PM

## 2010-12-15 NOTE — Letter (Signed)
Summary: [facility]  [facility]   Imported By: Maryln Gottron 10/15/2010 13:56:28  _____________________________________________________________________  External Attachment:    Type:   Image     Comment:   External Document

## 2010-12-15 NOTE — Assessment & Plan Note (Signed)
Summary: 3-4 MONTH ROA//HL/PT RSC/CJR   Vital Signs:  Patient profile:   55 year old female Menstrual status:  postmenopausal Weight:      205 pounds Pulse rate:   66 / minute BP sitting:   110 / 70  (left arm) Cuff size:   regular  Vitals Entered By: Romualdo Bolk, CMA (AAMA) (January 18, 2010 1:51 PM) CC: follow-up visit, Hypertension Management   History of Present Illness: Amy Orozco comesin today for  follow up of multiple medical problems.  She missed her last appt in Feb  and is here  today.  Trying to exercise   using potassium   for leg cramps.  BP taking meds  and seems controlled  Mood : taking lexapro has helped   .   With coping with job and business.   Abnormal lfts.  Felt to mbe fatty liver  Had   MRi of breast     and wants to change  .    because of schedule.     Dr Darnelle Catalan has seen result.   To get mammogram.  Bladder urgency   and leaking  and no change with meds.    supposed to do  .  Hypertension History:      She denies headache, chest pain, palpitations, dyspnea with exertion, orthopnea, PND, peripheral edema, visual symptoms, neurologic problems, syncope, and side effects from treatment.  She notes no problems with any antihypertensive medication side effects.        Positive major cardiovascular risk factors include female age 32 years old or older and hypertension.  Negative major cardiovascular risk factors include non-tobacco-user status.     Preventive Screening-Counseling & Management  Alcohol-Tobacco     Alcohol drinks/day: 0     Smoking Status: quit     Year Quit: age 7  Caffeine-Diet-Exercise     Caffeine use/day: 3     Does Patient Exercise: yes  Current Medications (verified): 1)  Lexapro 10 Mg Tabs (Escitalopram Oxalate) .Marland Kitchen.. 1 By Mouth Once Daily 2)  Ziac 5-6.25 Mg Tabs (Bisoprolol-Hydrochlorothiazide) .Marland Kitchen.. 1 By Mouth Once Daily For High Blood Pressure 3)  Potassium Chloride Crys Cr 10 Meq Cr-Tabs (Potassium Chloride Crys Cr)  .Marland Kitchen.. 1 By Mouth Once Daily  Allergies (verified): 1)  ! Penicillin 2)  ! Sulfa 3)  ! Codeine 4)  ! * Shellfish  Past History:  Past medical, surgical, family and social histories (including risk factors) reviewed, and no changes noted (except as noted below).  Past Medical History: Hypertension  about 10 years  Breast cancer, hx of 2008?    in situ  left   on mri q 2 years.  Pt was on Tamoxfen d/c this on 11/09. Abnormal LFTS MRI fatty liver and hemangioma 2010 Consults Dr. Priscille Heidelberg Dr. Gwendolyn Fill [facility]       LAST Mammogram: 11/17/2008 Pap: 12/2007  abnormal in 1988.  Td: More than 10 years Colonscopy: 2009 remote hx of abnormal pap and had cone biopsy.  and was ok.  EKG:  unsure Dexa: 11/2007 Eye Exam: 11/2008 Other: Smoking: Former Colonic polyps, hx of Allergic rhinitis  Past Surgical History: Reviewed history from 12/03/2008 and no changes required. Lumpectomy- Left Breast Cone Bx- 1988 for abnormal pap  Past History:  Care Management: Gynecology: Hyacinth Meeker Gastroenterology: Corinda G[facility]ler GI Hematology/Oncology: Magaranat  Family History: Reviewed history from 09/28/2009 and no changes required. Father: Heart attack, stroke Mother: asthma, degentive joint disease, thyroid problems, heart disease,  pcirculatory problems phlebitis  no DVT. Siblings: All Healthy   mom OA , aunt RA.   Social History: Reviewed history from 09/28/2009 and no changes required. 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         some incontincence   meds didnt help in past.   Physical Exam  General:  Well-developed,well-nourished,in no acute distress; alert,appropriate and cooperative throughout examination Head:  normocephalic and atraumatic.   Eyes:  vision grossly intact, pupils equal, and pupils round.   Neck:  No deformities, masses, or tenderness noted. Lungs:  Normal respiratory effort, chest expands symmetrically. Lungs are clear to auscultation, no crackles or wheezes. Heart:  Normal rate and regular rhythm. S1 and S2 normal without gallop, murmur, click, rub or other extra sounds. Abdomen:  Bowel sounds positive,abdomen soft and non-tender without masses, organomegaly or  noted. Msk:  no acute changes  Extremities:  no clubbing cyanosis or edema  Neurologic:  non focal  Skin:  turgor normal, color normal, no ecchymoses, no petechiae, no purpura, and no ulcerations.   Cervical Nodes:  No lymphadenopathy noted Psych:  Oriented X3, normally interactive, good eye contact, not depressed appearing, and slightly anxious.     Impression & Recommendations:  Problem # 1:  HYPERTENSION (ICD-401.9) Assessment Unchanged  Her updated medication list for this problem includes:    Ziac 5-6.25 Mg Tabs (Bisoprolol-hydrochlorothiazide) .Marland Kitchen... 1 by mouth once daily for high blood pressure  BP today: 110/70 Prior BP: 120/80 (11/04/2009)  Prior 10 Yr Risk Heart Disease: Not enough information (08/24/2009)  Labs Reviewed: K+: 4.1 (05/29/2009) Creat: : 0.91 (05/29/2009)     Problem # 2:  LIVER FUNCTION TESTS, ABNL  MRI FATTY LIVER HEMANG (ICD-794.8)  Orders: Venipuncture ([address]109) TLB-Hepatic/Liver Function Pnl (80076-HEPATIC)  Advised patient to avoid alcohol and Tylenol. Call for worsening symptoms.   Problem # 3:  ADJ DISORDER WITH MIXED ANXIETY & DEPRESSED MOOD (ICD-309.28) Assessment: Improved agree with staying on lexapro  and doing well   sample 21 given  Problem # 4:  BREAST CANCER,  HX OF LEFT (ICD-V10.3) Assessment: Comment Only  Problem # 5:  Preventive Health Care (ICD-V70.0) ok for [place] to do paps for convenience    all have bben normal since initial cone over 20 years ago?      disc if having problems would go backj to gyne.    Scheduling and time off is an issue.    Complete Medication List: 1)  Lexapro 10 Mg Tabs (Escitalopram oxalate) .Marland Kitchen.. 1 by mouth once daily 2)  Ziac 5-6.25 Mg Tabs (Bisoprolol-hydrochlorothiazide) .Marland Kitchen.. 1 by mouth once daily for high blood pressure 3)  Potassium Chloride Crys Cr 10 Meq Cr-tabs (Potassium chloride crys cr) .Marland Kitchen.. 1 by mouth once daily  Hypertension Assessment/Plan:      The patient's hypertensive risk group is category B: At least one risk factor (excluding diabetes) with no target organ damage.  Today's blood pressure is 110/70.  Her blood pressure goal is < 140/90.  Patient Instructions: 1)  continue medication   2)  You will be informed  of lab results when available.  3)  then CPX with pap and labs in about 6 months or as needed.  Prescriptions: LEXAPRO 10 MG TABS (ESCITALOPRAM OXALATE) 1 by mouth once daily  #90 x 1   Entered and Authorized by:   Madelin Headings MD   Signed by:   Madelin Headings MD on 01/18/2010   Method used:   Electronically to        [facility]* (mail-order)       [address] [place], [place]  [address]       Ph: [phone]       Fax: [phone]   RxID:   [id number]

## 2010-12-16 NOTE — Progress Notes (Signed)
Summary: MED INCREASE  Phone Note Refill Request Message from:  Patient  Refills Requested: Medication #1:  LEXAPRO 10 MG TABS 2 by mouth once daily PT WOULD LIKE LEXAPRO 20 MG INSTEAD 10 MG [facility]  Initial call taken by: Heron Sabins,  October 26, 2010 12:43 PM  Follow-up for Phone Call        Call patient and ask how she is doing and if already increased dose ok to increase to   20 mg per day  and rx   and call of ov in 3-4 weeks   after increasing   can rx for  Lexapro 20 disp 30  Follow-up by: Madelin Headings MD,  October 26, 2010 1:17 PM  Additional Follow-up for Phone Call Additional follow up Details #1::        Pt has been on 20 mg of Lexapro x 6 weeks and is doing well. Additional Follow-up by: Lynann Beaver CMA AAMA,  October 26, 2010 1:31 PM    New/Updated Medications: LEXAPRO 20 MG TABS (ESCITALOPRAM OXALATE) one by mouth daily Prescriptions: LEXAPRO 20 MG TABS (ESCITALOPRAM OXALATE) one by mouth daily  #30 x 1   Entered by:   Lynann Beaver CMA AAMA   Authorized by:   Madelin Headings MD   Signed by:   Lynann Beaver CMA AAMA on 10/26/2010   Method used:   [facility]        [facility]  [address] [id number]       [address]       [place], [place]  [address]       Ph: [phone]       Fax: [phone]   RxID:   [id number]

## 2010-12-22 NOTE — Assessment & Plan Note (Signed)
Summary: 2 month f/u//alp/pt rsc/cjr   Vital Signs:  Patient profile:   55 year old female Menstrual status:  postmenopausal Weight:      212 pounds Pulse rate:   66 / minute BP sitting:   110 / 70  (left arm) Cuff size:   regular  Vitals Entered By: Romualdo Bolk, CMA (AAMA) (December 13, 2010 1:22 PM) CC: Follow-up visit on hgb and lexapro. Pt thinks lexapro 20mg  is too strong and wants to change it to something else.   History of Present Illness: Amy Orozco comes in today  for  follow up fo anemia and lexapro.  Mood: some  drowsiness.  and weight gain...? if  not helping enough.  sometimes irritabilty. Sleep seems ok. energy some a bit. ? if  change  meds.   remote hx of effexor in the past near menopause. .Asks about pristique  Some downsizing at work.  Extra stress. Hasnt donated blood and had hg in Jan and was ok.   asks about donating again.  NO bleeding on increaseing fatigue or cv symptoms . BP seem stable.   Preventive Screening-Counseling & Management  Alcohol-Tobacco     Alcohol drinks/day: 0     Smoking Status: quit     Year Quit: age 54  Caffeine-Diet-Exercise     Caffeine use/day: 3     Does Patient Exercise: yes  Current Medications (verified): 1)  Lexapro 20 Mg Tabs (Escitalopram Oxalate) .... One By Mouth Daily 2)  Ziac 5-6.25 Mg Tabs (Bisoprolol-Hydrochlorothiazide) .Marland Kitchen.. 1 By Mouth Once Daily For High Blood Pressure 3)  Potassium Chloride Crys Cr 10 Meq Cr-Tabs (Potassium Chloride Crys Cr) .Marland Kitchen.. 1 By Mouth Once Daily  Allergies (verified): 1)  ! Penicillin 2)  ! Sulfa 3)  ! Codeine 4)  ! * Shellfish  Past History:  Past medical, surgical, family and social histories (including risk factors) reviewed, and no changes noted (except as noted below).  Past Medical History: Reviewed history from 01/18/2010 and no changes required. Hypertension  about 10 years  Breast cancer, hx of 2008?    in situ  left   on mri q 2 years.  Pt was on Tamoxfen d/c  this on 11/09. Abnormal LFTS MRI fatty liver and hemangioma 2010 Consults Dr. Priscille Heidelberg Dr. Gwendolyn Fill [facility]       LAST Mammogram: 11/17/2008 Pap: 12/2007  abnormal in 1988.  Td: More than 10 years Colonscopy: 2009 remote hx of abnormal pap and had cone biopsy.  and was ok.  EKG:  unsure Dexa: 11/2007 Eye Exam: 11/2008 Other: Smoking: Former Colonic polyps, hx of Allergic rhinitis  Past Surgical History: Reviewed history from 12/03/2008 and no changes required. Lumpectomy- Left Breast Cone Bx- 1988 for abnormal pap  Past History:  Care Management: Gynecology: Hyacinth Meeker Gastroenterology: [facility] Hematology/Oncology: [facility]  Family History: Reviewed history from 09/28/2009 and no changes required. Father: Heart attack, stroke Mother: asthma, degentive joint disease, thyroid problems, heart disease,  pcirculatory problems phlebitis  no DVT. Siblings: All Healthy   mom OA , aunt RA.   Social History: Reviewed history from 09/28/2009 and no changes required. 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  see hpin  Physical Exam  General:  Well-developed,well-nourished,in no acute distress; alert,appropriate and cooperative throughout examination Head:  normocephalic and atraumatic.   Neck:  No deformities, masses, or tenderness noted. Lungs:  normal respiratory effort and no intercostal retractions.   Heart:  normal rate and regular rhythm.   Skin:  no ecchymoses and no petechiae.   Cervical Nodes:  No lymphadenopathy noted Psych:  Oriented X3, memory intact for recent and remote, normally interactive, good eye contact, and not anxious appearing.  cognition  nl no tremor    Impression & Recommendations:  Problem # 1:  ADJ DISORDER WITH MIXED ANXIETY & DEPRESSED MOOD (ICD-309.28) Assessment Deteriorated ? worse or   sub optimal rx  .   she is taking alt 20 and 10   some drowsiness also. some irritabilty .   reasonable eto try different med or  augment .  consider other changes  but for now add on wellbutrin   Problem # 2:  ANEMIA, MILD (ICD-285.9) Assessment: Improved  better   after stopping blood donation so can resume with monitoring  take extra iron. Hgb: 12.5 (11/19/2010)   Hct: 34.7 (09/08/2010)   Platelets: 215.0 (09/08/2010) RBC: 4.22 (09/08/2010)   RDW: 14.1 (09/08/2010)   WBC: 4.9 (09/08/2010) MCV: 82.2 (09/08/2010)   MCHC: 34.0 (09/08/2010) Ferritin: 35.1 (07/10/2009) TSH: 4.28 (09/08/2010)  Problem # 3:  HYPERTENSION (ICD-401.9)  Her updated medication list for this problem includes:    Ziac 5-6.25 Mg Tabs (Bisoprolol-hydrochlorothiazide) .Marland Kitchen... 1 by mouth once daily for high blood pressure  BP today: 110/70 Prior BP: 110/80 (09/14/2010)  Prior 10 Yr Risk Heart Disease: Not enough information (08/24/2009)  Labs Reviewed: K+: 4.2 (09/08/2010) Creat: : 0.9 (09/08/2010)   Chol: 184 (09/08/2010)   HDL: 44.20 (09/08/2010)   LDL: 116 (09/08/2010)   TG: 118.0 (09/08/2010)  Complete Medication List: 1)  Lexapro 20 Mg Tabs (Escitalopram oxalate) .... One by mouth daily 2)  Ziac 5-6.25 Mg Tabs (Bisoprolol-hydrochlorothiazide) .Marland Kitchen.. 1 by mouth once daily for high blood pressure 3)  Potassium Chloride Crys Cr 10 Meq Cr-tabs (Potassium chloride crys cr) .Marland Kitchen.. 1 by mouth once daily 4)  Bupropion Hcl 150 Mg Xr24h-tab (Bupropion hcl) .Marland Kitchen.. 1 by mouth once daily   may increase to 2 by mouth once daily  Patient Instructions: 1)  wean  down to 10 of lexapro  2)  then add on augmentation of wellbutrin   150 mg per day 3)  Ok to  donate blood but   increase iron intake.  4)  return office visit 2 months . Call in meantime  if  needed. Prescriptions: BUPROPION HCL 150 MG XR24H-TAB (BUPROPION HCL) 1 by mouth once daily   may increase to 2 by mouth once daily  #30 x 3   Entered and Authorized by:   Madelin Headings MD   Signed by:   Madelin Headings MD on 12/13/2010   Method used:   [facility] to        [facility] [address] [id number]       [address]       [place], [place]  [address]       Ph: [phone]       Fax: [phone]   RxID:   [id number]    Orders Added: 1)  Est. Patient Level IV [24401]

## 2010-12-24 ENCOUNTER — Other Ambulatory Visit: Payer: Self-pay | Admitting: Internal Medicine

## 2011-01-03 ENCOUNTER — Telehealth: Payer: Self-pay | Admitting: *Deleted

## 2011-01-03 MED ORDER — BISOPROLOL-HYDROCHLOROTHIAZIDE 5-6.25 MG PO TABS: 90.0000 | ORAL_TABLET | Freq: Every day | ORAL | Status: DC

## 2011-01-03 NOTE — Telephone Encounter (Signed)
Rx sent to pharmacy   

## 2011-01-05 ENCOUNTER — Telehealth: Payer: Self-pay | Admitting: Internal Medicine

## 2011-01-05 NOTE — Telephone Encounter (Signed)
Pt was told by [facility], that in order for pts insurance to cover generic Ziac, she would have to get at 90 day supply. Pls write script for Bisoproloa 5-6.25mg  90 day supply, and call in to [facility] [address]

## 2011-01-06 NOTE — Telephone Encounter (Signed)
rx sent to pharmacy

## 2011-02-08 ENCOUNTER — Ambulatory Visit
Admission: RE | Admit: 2011-02-08 | Discharge: 2011-02-08 | Disposition: A | Discharge status: Home / Self Care | Payer: 59 | Source: Ambulatory Visit | Attending: Oncology | Admitting: Oncology

## 2011-02-08 DIAGNOSIS — Z9889 Other specified postprocedural states: Secondary | ICD-10-CM

## 2011-03-17 ENCOUNTER — Encounter: Payer: Self-pay | Admitting: Internal Medicine

## 2011-03-21 ENCOUNTER — Ambulatory Visit: Payer: Self-pay | Admitting: Internal Medicine

## 2011-03-28 ENCOUNTER — Encounter: Payer: Self-pay | Admitting: Internal Medicine

## 2011-03-28 ENCOUNTER — Ambulatory Visit (INDEPENDENT_AMBULATORY_CARE_PROVIDER_SITE_OTHER): Payer: 59 | Admitting: Internal Medicine

## 2011-03-28 VITALS — BP 128/80 | HR 97 | Temp 98.4°F | Wt 211.0 lb

## 2011-03-28 DIAGNOSIS — F4323 Adjustment disorder with mixed anxiety and depressed mood: Secondary | ICD-10-CM

## 2011-03-28 DIAGNOSIS — D649 Anemia, unspecified: Secondary | ICD-10-CM

## 2011-03-28 DIAGNOSIS — N3281 Overactive bladder: Secondary | ICD-10-CM | POA: Insufficient documentation

## 2011-03-28 DIAGNOSIS — R3915 Urgency of urination: Secondary | ICD-10-CM

## 2011-03-28 DIAGNOSIS — N318 Other neuromuscular dysfunction of bladder: Secondary | ICD-10-CM

## 2011-03-28 LAB — CBC WITH DIFFERENTIAL/PLATELET
Basophils Absolute: 0 10*3/uL (ref 0.0–0.1)
Eosinophils Absolute: 0.1 10*3/uL (ref 0.0–0.7)
Lymphocytes Relative: 29.3 % (ref 12.0–46.0)
MCHC: 34 g/dL (ref 30.0–36.0)
Neutrophils Relative %: 61.9 % (ref 43.0–77.0)
RBC: 4.8 Mil/uL (ref 3.87–5.11)
RDW: 15.2 % — ABNORMAL HIGH (ref 11.5–14.6)

## 2011-03-28 LAB — T4, FREE: Free T4: 0.8 ng/dL (ref 0.60–1.60)

## 2011-03-28 LAB — TSH: TSH: 3.79 u[IU]/mL (ref 0.35–5.50)

## 2011-03-28 LAB — IBC PANEL
Iron: 33 ug/dL — ABNORMAL LOW (ref 42–145)
Saturation Ratios: 7.1 % — ABNORMAL LOW (ref 20.0–50.0)
Transferrin: 333.4 mg/dL (ref 212.0–360.0)

## 2011-03-28 MED ORDER — FESOTERODINE FUMARATE ER 4 MG PO TB24: 4.0000 mg | ORAL_TABLET | Freq: Every day | ORAL | Status: DC

## 2011-03-28 NOTE — Patient Instructions (Addendum)
Consider see psychiatric  For medication consult   For your situation.   Can try Gala Murdoch once a day.  Disc with  Dr Darnelle Catalan .  And or Dr Hyacinth Meeker about the issues of ovarian cancer risk.  Get iron studies today  And let you know results. FU depending     On results.

## 2011-03-28 NOTE — Progress Notes (Signed)
Subjective:    Patient ID: Amy Orozco, female    DOB: 1955/12/29, 55 y.o.   MRN: [id number]  HPI Pt comesin for fu of  multiple medical issues  Moods: still undera lot ofwork stress and feels  Irritated under pressure and not better .     Now on wellbutrin   No sig se of meds  Decrease motivation but is working out.   Now off the lexapro.   For a while.   Has increase eating of ice   Use recently.   NO bleeding or anemia dx   OAB bladder sx and  vesicare some help but not that greast   Would like to try T[place] Has urgency  but no uti sx .  No constipation with this. Review of Systems Neg cp sob  Other sig gi gu issues. No new rashes  Fever weigh t los  Adenopathy.  Past Medical History  Diagnosis Date  . Hypertension   . Breast cancer     hx 2008 in situ left on mri every 2 yr  . LFTs abnormal 2010    MRI fatty liver and hemangioma  . History of abnormal Pap smear     remote and had a cone biopsy  . Hx of colonic polyps   . Allergic rhinitis    Past Surgical History  Procedure Date  . Cervical cone biopsy 1988    abnormal pap  . Breast lumpectomy     left breast    reports that she has quit smoking. She has never used smokeless tobacco. She reports that she does not drink alcohol or use illicit drugs. family history includes Asthma in her mother; Heart attack in her father; Heart disease in her mother; Osteoarthritis in her mother; Other in her mother; Rheumatologic disease in an unspecified family member; Stroke in her father; and Thyroid disease in her mother. Allergies  Allergen Reactions  . Codeine     REACTION: Nausea  . Penicillins     REACTION: rash  . Sulfonamide Derivatives     REACTION: rash         Objective:   Physical Exam Physical Exam: Vital signs reviewed WUJ:WJXB is a well-developed well-nourished alert cooperative  white female who appears her stated age in no acute distress.  HEENT: normocephalic  traumatic , grossly normal NECK: supple  without masses, thyromegaly or bruits. CHEST/PULM:  Clear to auscultation and percussion breath sounds equal no wheeze , rales or rhonchi. No chest wall deformities or tenderness. CV: PMI is nondisplaced, S1 S2 no gallops, murmurs, rubs. Peripheral pulses are full without delay.No JVD .  ABDOMEN: Bowel sounds normal nontender  No guard or rebound, no hepato splenomegal no CVA tenderness.   Extremtities:  No clubbing cyanosis or edema, no acute joint swelling or redness no focal atrophy NEURO:  Oriented x3, cranial nerves 3-12 appear to be intact, no obvious focal weakness,gait within normal limits PSYCH: Oriented, good eye contact, , cognition and judgment appear normal. A bit down but only slightly depresses facies.      Assessment & Plan:  OAB?    Mod improvement   With vesicare.     Menopausal problem probably adding to situation. Mood    Not resonding to ssri and then wellbutrin  She has been on meds in the past . I think the best path would be to do a psych med consult to get the right combo of meds for her.  Counseled. About stress  She has  good insight otherwise,  Ice craving   Consider iron deficiency  Check today  Total visit > 50% spent counseling and coordinating care

## 2011-04-03 ENCOUNTER — Encounter: Payer: Self-pay | Admitting: Internal Medicine

## 2011-04-12 ENCOUNTER — Telehealth: Payer: Self-pay | Admitting: *Deleted

## 2011-04-12 DIAGNOSIS — D649 Anemia, unspecified: Secondary | ICD-10-CM

## 2011-04-12 NOTE — Telephone Encounter (Signed)
Message copied by Romualdo Bolk on Tue Apr 12, 2011 10:49 AM ------      Message from: [facility], [place] [facility]      Created: Mon Apr 11, 2011  7:42 PM       Tell patient that she is iron deficient but not anemic.   Her thyroid if normally functioning  Although she has slight increase in thyroid antibodies.               Take OTC  iron supplement at least once a day twice a day if she can tolerated this   And   Will follow thyroid levels and iron levels.            Recheck TSh and free T4 and ferrtin and cbcdiff in 3 months. Then rov       Dx thyroiditis and iron deficiency.

## 2011-04-12 NOTE — Telephone Encounter (Signed)
Pt aware of results and orders placed in EPIC. Pt to call back to schedule these appts.

## 2011-04-20 ENCOUNTER — Other Ambulatory Visit: Payer: Self-pay | Admitting: Internal Medicine

## 2011-06-24 ENCOUNTER — Other Ambulatory Visit (INDEPENDENT_AMBULATORY_CARE_PROVIDER_SITE_OTHER): Payer: 59

## 2011-06-24 DIAGNOSIS — D649 Anemia, unspecified: Secondary | ICD-10-CM

## 2011-06-24 DIAGNOSIS — E069 Thyroiditis, unspecified: Secondary | ICD-10-CM

## 2011-06-24 LAB — CBC WITH DIFFERENTIAL/PLATELET
Basophils Absolute: 0 10*3/uL (ref 0.0–0.1)
Eosinophils Absolute: 0.1 10*3/uL (ref 0.0–0.7)
HCT: 39.7 % (ref 36.0–46.0)
Hemoglobin: 13.3 g/dL (ref 12.0–15.0)
Lymphs Abs: 1.9 10*3/uL (ref 0.7–4.0)
MCHC: 33.4 g/dL (ref 30.0–36.0)
MCV: 83.1 fl (ref 78.0–100.0)
Monocytes Absolute: 0.5 10*3/uL (ref 0.1–1.0)
Neutro Abs: 2.3 10*3/uL (ref 1.4–7.7)
RDW: 15.9 % — ABNORMAL HIGH (ref 11.5–14.6)

## 2011-06-30 ENCOUNTER — Ambulatory Visit (INDEPENDENT_AMBULATORY_CARE_PROVIDER_SITE_OTHER): Payer: 59 | Admitting: Internal Medicine

## 2011-06-30 ENCOUNTER — Encounter: Payer: Self-pay | Admitting: Internal Medicine

## 2011-06-30 DIAGNOSIS — R3915 Urgency of urination: Secondary | ICD-10-CM

## 2011-06-30 DIAGNOSIS — E669 Obesity, unspecified: Secondary | ICD-10-CM

## 2011-06-30 DIAGNOSIS — R609 Edema, unspecified: Secondary | ICD-10-CM

## 2011-06-30 DIAGNOSIS — I8390 Asymptomatic varicose veins of unspecified lower extremity: Secondary | ICD-10-CM

## 2011-06-30 LAB — HEPATIC FUNCTION PANEL
Bilirubin, Direct: 0 mg/dL (ref 0.0–0.3)
Total Bilirubin: 0.4 mg/dL (ref 0.3–1.2)
Total Protein: 7 g/dL (ref 6.0–8.3)

## 2011-06-30 LAB — BASIC METABOLIC PANEL
CO2: 26 mEq/L (ref 19–32)
Calcium: 10.2 mg/dL (ref 8.4–10.5)
Creatinine, Ser: 1 mg/dL (ref 0.4–1.2)

## 2011-06-30 LAB — POCT URINALYSIS DIPSTICK
Glucose, UA: NEGATIVE
Spec Grav, UA: 1.015
Urobilinogen, UA: 0.2

## 2011-06-30 MED ORDER — FUROSEMIDE 20 MG PO TABS: ORAL_TABLET | ORAL | Status: DC

## 2011-06-30 NOTE — Patient Instructions (Signed)
Ok to take  Lasix for a few days for a trial.   Will do a referral for veins and  Urology.   Take the iron  Every day and we will  Follow up Will notify you  of labs when available.

## 2011-06-30 NOTE — Progress Notes (Signed)
  Subjective:    Patient ID: Amy Orozco, female    DOB: 1956/09/13, 55 y.o.   MRN: [id number]  HPI Comes in for follow up of  multiple medical problems .and concerns MOst concerning to her is lower extremity Swelling worse at  End of day and  Getting worse .   Travels    In car  To [place].  Has noted this for about for a month or so.   Areas on legs are pink and discolored .  No Med change  But taking a supplement  For menopause  That is not helping   Had doppler before on the left leg  But both are now swelling. Bladder issues frequency and UI Medicine not helping  ? If bladder  Emptying problem.  NOt bleeding and not taking iron pills just inc in diet .  No bruising or bleeding   Mood still an issue   Tired worried about her weight  ? If diet med would help. *( remote hx of ? Redux  Review of Systems Neg cp sob weight loss ,bleeding   ( had neg gi eval)  No fevers   Past history family history social history reviewed in the electronic medical record.     Objective:   Physical Exam  WDWN in nad looks well extr 1+ edema  To trace but some faint pink areas chronic changes  And varicose veins noted  Labs reviewed with patient. Low ferritin  Increase RDW  Thyroid in range      Assessment & Plan:  Iron  deficiency   Not anemia.   Minimal improvement with food changes rec take oral iron at this point.  Leg  Sx  Bilateral   Edema now with some chronic changes and discomfort and bilateral .  Dependent edema  No ob cardiac metabolic cause but check bmp and albumin At this point rec dc the supplement as not helping and is a supplement and thus untested. Sleep mood and perimenopausal sx not responsive to ssri and wellbutrin   Obesity interested in diet pill new ones as part of the redux used in the past helped her mood  Would not rec at this time   Did disc the phentermine /topamax combo now approved.  Bladder issues   ? If has incomplete emptying or other bladder dysfunction.     Refer  for opinion.  Hx of Br cancer in situ 2008   Estrogen CI  Total visit > 50% spent counseling and coordinating care  Plan refer for vascular consult  For edema and VVs. ? If  Needed rx .

## 2011-07-01 ENCOUNTER — Ambulatory Visit: Payer: 59 | Admitting: Internal Medicine

## 2011-07-06 ENCOUNTER — Telehealth: Payer: Self-pay | Admitting: *Deleted

## 2011-07-06 DIAGNOSIS — E611 Iron deficiency: Secondary | ICD-10-CM

## 2011-07-06 DIAGNOSIS — R945 Abnormal results of liver function studies: Secondary | ICD-10-CM

## 2011-07-06 DIAGNOSIS — R7989 Other specified abnormal findings of blood chemistry: Secondary | ICD-10-CM

## 2011-07-06 NOTE — Telephone Encounter (Signed)
Left message to call back  

## 2011-07-06 NOTE — Telephone Encounter (Signed)
Message copied by Romualdo Bolk on Wed Jul 06, 2011 11:07 AM ------      Message from: [facility], [place] [facility]      Created: Tue Jul 05, 2011  5:49 PM       Tell patient labs shows minor liver blip that is not related to her sx.  Rest of lab ok can send her a copy.       Would repeat lfts , cbc diff and ibc panel in 3 months and rov .  Dx abnormal lfts and iron deficiency       (Consults pending in the meantime.)

## 2011-07-06 NOTE — Telephone Encounter (Signed)
Pt aware of results and will call back to schedule an appt.

## 2011-07-29 ENCOUNTER — Other Ambulatory Visit: Payer: Self-pay | Admitting: Internal Medicine

## 2011-07-29 ENCOUNTER — Encounter: Payer: Self-pay | Admitting: Vascular Surgery

## 2011-08-01 ENCOUNTER — Encounter: Payer: Self-pay | Admitting: Vascular Surgery

## 2011-08-01 ENCOUNTER — Ambulatory Visit (INDEPENDENT_AMBULATORY_CARE_PROVIDER_SITE_OTHER): Payer: 59 | Admitting: Vascular Surgery

## 2011-08-01 ENCOUNTER — Ambulatory Visit (INDEPENDENT_AMBULATORY_CARE_PROVIDER_SITE_OTHER): Payer: 59 | Admitting: *Deleted

## 2011-08-01 VITALS — BP 158/98 | HR 78 | Resp 20 | Ht 66.0 in | Wt 215.0 lb

## 2011-08-01 DIAGNOSIS — M79609 Pain in unspecified limb: Secondary | ICD-10-CM

## 2011-08-01 DIAGNOSIS — I8310 Varicose veins of unspecified lower extremity with inflammation: Secondary | ICD-10-CM

## 2011-08-01 NOTE — Progress Notes (Signed)
Subjective:     Patient ID: Amy Orozco, female   DOB: [birth year], 55 y.o.   MRN: [id number]  HPI this 55 year old female was referred for possible venous insufficiency she has a history of varicose veins but has noted some spider veins in both legs over the last several years which have become increasingly symptomatic. She complains of aching and throbbing discomfort as the day progresses. She does have some swelling in the left leg worse than the right as the day progresses. He has no history of DVT, thrombophlebitis, stasis ulcers, bleeding, or other complications. She denies claudication symptoms. She does not wear elastic compression stockings.  Past Medical History  Diagnosis Date  . Hypertension   . Breast cancer     hx 2008 in situ left on mri every 2 yr  . LFTs abnormal 2010    MRI fatty liver and hemangioma  . History of abnormal Pap smear     remote and had a cone biopsy  . Hx of colonic polyps   . Allergic rhinitis     History  Substance Use Topics  . Smoking status: Former Games developer  . Smokeless tobacco: Never Used  . Alcohol Use: No    Family History  Problem Relation Age of Onset  . Asthma Mother   . Heart disease Mother   . Other Mother     Degentive Joint Disease,  circulatory problems phelbitis, no DVT  . Thyroid disease Mother   . Osteoarthritis Mother   . Heart attack Father   . Stroke Father   . Rheumatologic disease      aunt    Allergies  Allergen Reactions  . Codeine     REACTION: Nausea  . Penicillins     REACTION: rash  . Sulfonamide Derivatives     REACTION: rash    Current outpatient prescriptions:bisoprolol-hydrochlorothiazide (ZIAC) 5-6.25 MG per tablet, TAKE 1 TABLET BY MOUTH EVERY DAY, Disp: 90 tablet, Rfl: 0;  furosemide (LASIX) 20 MG tablet, 1 po as directed for swelling, Disp: 20 tablet, Rfl: 0;  OVER THE COUNTER MEDICATION, amberen , Disp: , Rfl: ;  potassium chloride (K-DUR) 10 MEQ tablet, Take 10 mEq by mouth daily.  , Disp: , Rfl:     BP 158/98  Pulse 78  Resp 20  Ht 5\' 6"  (1.676 m)  Wt 215 lb (97.523 kg)  BMI 34.70 kg/m2  Body mass index is 34.70 kg/(m^2).        Review of Systems complains of some weight gain, arthritis, joint pain, dyspnea on exertion, anemia, depression, reflux esophagitis, and urinary frequency all other systems are negative    Objective:   Physical Exam pressure 150/98 heart rate 78 respirations 20 Generally she is a well-developed well-nourished female in no apparent distress alert and oriented x3 Chest clear to auscultation no rhonchi or wheezing Cardiovascular regular rhythm no murmurs Abdomen soft nontender no masses palpable Skin free of rashes Musculoskeletal exam 3 of major deformities Neurologic normal Lower extremity exam reveals 3+ femoral and dorsalis pedis pulses palpable bilaterally. There are diffuse spider and small reticular veins particularly from the knee to the ankle bilaterally. There is mild edema in the left ankle and 1+. No stasis ulcers or hyperpigmentation is noted.  Today I ordered a venous duplex exam which I reviewed and interpreted. There is no DVT. There is very mild reflux at the saphenofemoral junction bilaterally but not throughout the great saphenous veins.    Assessment:    bilateral spider and reticular  veins-symptomatic. No evidence of significant reflux and great saphenous systems    Plan:    possible sclerotherapy for spider and reticular veins bilaterally depending on patient's desires.

## 2011-08-10 NOTE — Procedures (Unsigned)
LOWER EXTREMITY VENOUS REFLUX EXAM  INDICATION:  Bilateral lower extremity edema.  EXAM:  Using color-flow imaging and pulse Doppler spectral analysis, the right and left common femoral, femoral, popliteal, posterior tibial, great and small saphenous veins were evaluated.  There is evidence suggesting deep venous insufficiency in the right and left lower extremity at the level of the common femoral vein.  The right and left saphenofemoral junction is not competent with reflux of >500 milliseconds.  The right and left GSV is competent.  The right and left proximal short saphenous vein demonstrates competency.  GSV Diameter (used if found to be incompetent only)                                           Right    Left Proximal Greater Saphenous Vein           0.71 cm  0.61 cm Proximal-to-mid-thigh                     cm       cm Mid thigh                                 cm       cm Mid-distal thigh                          cm       cm Distal thigh                              cm       cm Knee                                      cm       cm  IMPRESSION: 1. The right and left great saphenous vein is not competent with     reflux of >500 milliseconds at the saphenofemoral junction. 2. The right and left great saphenous vein is not tortuous. 3. The deep venous system is not competent with reflux of >500     milliseconds at the level of the common femoral vein. 4. The right and left small saphenous vein is competent.  ___________________________________________ Quita Skye. Hart Rochester, M.D.  EM/MEDQ  D:  08/02/2011  T:  08/02/2011  Job:  [id number]

## 2011-09-12 ENCOUNTER — Telehealth: Payer: Self-pay | Admitting: *Deleted

## 2011-09-12 NOTE — Telephone Encounter (Signed)
Pt is calling back for meds from Dr. Fabian Sharp.

## 2011-09-12 NOTE — Telephone Encounter (Signed)
See past 2 notes reviewed .  At that time I had recommended she see psychiatry because the meds  Prescribed have not helped her depression very much .  Did she ever see psychiatrist ?  I can see her in the short term  but still will advise  she see psychiatrist for medication consult. Because of past hx.     Appt Friday  11 15 am  : block 30 minutes.

## 2011-09-12 NOTE — Telephone Encounter (Signed)
Pt is calling in (crying), and requesting RX for depression and anxiety.  No acute situations, just has been spiraling downhill the last couple of months, no motivation.  No suicide plans, but feels at times she is worthless.

## 2011-09-13 NOTE — Telephone Encounter (Signed)
Appt scheduled

## 2011-09-15 ENCOUNTER — Ambulatory Visit: Payer: 59 | Admitting: Internal Medicine

## 2011-09-16 ENCOUNTER — Other Ambulatory Visit: Payer: Self-pay | Admitting: Oncology

## 2011-09-16 ENCOUNTER — Encounter: Payer: Self-pay | Admitting: Internal Medicine

## 2011-09-16 ENCOUNTER — Encounter (HOSPITAL_BASED_OUTPATIENT_CLINIC_OR_DEPARTMENT_OTHER): Payer: 59 | Admitting: Oncology

## 2011-09-16 ENCOUNTER — Ambulatory Visit (INDEPENDENT_AMBULATORY_CARE_PROVIDER_SITE_OTHER): Payer: 59 | Admitting: Internal Medicine

## 2011-09-16 VITALS — BP 140/100 | HR 72 | Wt 218.0 lb

## 2011-09-16 DIAGNOSIS — F4323 Adjustment disorder with mixed anxiety and depressed mood: Secondary | ICD-10-CM

## 2011-09-16 DIAGNOSIS — R41840 Attention and concentration deficit: Secondary | ICD-10-CM

## 2011-09-16 DIAGNOSIS — R4586 Emotional lability: Secondary | ICD-10-CM

## 2011-09-16 DIAGNOSIS — I10 Essential (primary) hypertension: Secondary | ICD-10-CM

## 2011-09-16 DIAGNOSIS — D0590 Unspecified type of carcinoma in situ of unspecified breast: Secondary | ICD-10-CM

## 2011-09-16 DIAGNOSIS — R413 Other amnesia: Secondary | ICD-10-CM

## 2011-09-16 DIAGNOSIS — F39 Unspecified mood [affective] disorder: Secondary | ICD-10-CM

## 2011-09-16 LAB — CBC WITH DIFFERENTIAL/PLATELET
Basophils Absolute: 0 10*3/uL (ref 0.0–0.1)
Eosinophils Absolute: 0.1 10*3/uL (ref 0.0–0.5)
HGB: 14.2 g/dL (ref 11.6–15.9)
MCV: 85.3 fL (ref 79.5–101.0)
MONO#: 0.5 10*3/uL (ref 0.1–0.9)
MONO%: 8.4 % (ref 0.0–14.0)
NEUT#: 2.9 10*3/uL (ref 1.5–6.5)
RDW: 13.5 % (ref 11.2–14.5)
WBC: 5.9 10*3/uL (ref 3.9–10.3)
lymph#: 2.3 10*3/uL (ref 0.9–3.3)

## 2011-09-16 LAB — COMPREHENSIVE METABOLIC PANEL
Albumin: 3.5 g/dL (ref 3.5–5.2)
BUN: 13 mg/dL (ref 6–23)
CO2: 28 mEq/L (ref 19–32)
Calcium: 10.3 mg/dL (ref 8.4–10.5)
Chloride: 103 mEq/L (ref 96–112)
Glucose, Bld: 100 mg/dL — ABNORMAL HIGH (ref 70–99)
Potassium: 3.7 mEq/L (ref 3.5–5.3)

## 2011-09-16 MED ORDER — ALPRAZOLAM 0.25 MG PO TABS: 0.2500 mg | ORAL_TABLET | Freq: Three times a day (TID) | ORAL | Status: DC | PRN

## 2011-09-16 MED ORDER — VILAZODONE HCL 40 MG PO TABS: 1.0000 | ORAL_TABLET | ORAL | Status: DC

## 2011-09-16 NOTE — Progress Notes (Signed)
  Subjective:    Patient ID: Amy Orozco, female    DOB: [birth year], 55 y.o.   MRN: [id number]  HPI Patient comes in today for acute  ecompensation with mood issues .  See phone note  Undergoing urology evaluation.    Weight out of control.  Mood swings and hard with her job. Crying a lot and irritable at times.  No other triggers. No suicidal or hopeless.  Has some night mares more recently . NO panic but does get anxious asks about benzos psrn. hasnt seen psych. See last notes. REview: lexapro wellb not that effective and poss se at higher doses.  cymbalta ? No help? effexor after br cancer dx may have helped something.  No sig etoh or caffiene issues.  Has increase distractibilty and attention  Asks about ADD  But hasnt had these sx since a child  Review of Systems No fever vision hearing changes  OAB under eval.   no new gi gu issues neruo changes  .  Bp Ok    Objective:   Physical Exam WDWN inand   Oriented x 3 and no noted deficits in memory, attention, and speech. Somewhat  ditsressed at times  And tearful but appropriate and good eye contact otherwise. Neuro grossly non focal no tremor      Assessment & Plan:  Depressive reactions ;  irritability  with some meds    Night mares. Sleep issues .  Has been on b blocker for a while so prob not from this. Some external stresses  Concentration issues can discuss with psych No evidence of obv other disease causing this Options discussed  Risk benefit of medication discussed. Prn xanax this does not rx depression  Sample of viibyrid to try and rx given  Fu here if not  appt with psych before one month rec psych  For med consults .    HT up today  To monitor   Total visit 40 mins > 50% spent counseling and coordinating care

## 2011-09-16 NOTE — Patient Instructions (Addendum)
Need    To have you make appt with a psychiatrist for medication evaluation for reasons discussed. In the meantime   ; new antidepressant and as needed xanax.   Take one a day could cause nausea   That should subside. Xanax can be used as needed for anxiety. But is not good for depression.

## 2011-09-17 DIAGNOSIS — R41840 Attention and concentration deficit: Secondary | ICD-10-CM | POA: Insufficient documentation

## 2011-09-17 LAB — VITAMIN D 25 HYDROXY (VIT D DEFICIENCY, FRACTURES): Vit D, 25-Hydroxy: 26 ng/mL — ABNORMAL LOW (ref 30–89)

## 2011-09-17 LAB — CANCER ANTIGEN 27.29: CA 27.29: 22 U/mL (ref 0–39)

## 2011-09-20 ENCOUNTER — Other Ambulatory Visit: Payer: 59 | Admitting: Lab

## 2011-09-27 ENCOUNTER — Ambulatory Visit (HOSPITAL_BASED_OUTPATIENT_CLINIC_OR_DEPARTMENT_OTHER): Payer: 59 | Admitting: Oncology

## 2011-09-27 ENCOUNTER — Telehealth: Payer: Self-pay | Admitting: *Deleted

## 2011-09-27 VITALS — BP 147/86 | HR 71 | Temp 98.5°F | Ht 65.0 in | Wt 215.3 lb

## 2011-09-27 DIAGNOSIS — R7402 Elevation of levels of lactic acid dehydrogenase (LDH): Secondary | ICD-10-CM

## 2011-09-27 DIAGNOSIS — D0590 Unspecified type of carcinoma in situ of unspecified breast: Secondary | ICD-10-CM

## 2011-09-27 DIAGNOSIS — C50919 Malignant neoplasm of unspecified site of unspecified female breast: Secondary | ICD-10-CM

## 2011-09-27 DIAGNOSIS — R635 Abnormal weight gain: Secondary | ICD-10-CM

## 2011-09-27 NOTE — Telephone Encounter (Signed)
GAVE PATIENT APPOINTMENT FOR EIGHT MONTHS.

## 2011-09-27 NOTE — Progress Notes (Signed)
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 Dr. Fabian Sharp has started her on ViiBryd and she is tolerating this well except for some loose bowel movements. She's developed some stress incontinence and that's being taken care of through urology with a urodynamic study pending  ROS: Otherwise a detailed review of systems is stable sometimes she gets bilateral ankle swelling and she is not quite sure what that is due to but it tends to resolve on its own she is a bit deconditioned and some short of breath when she climbs stairs she is having some aches and pains here and there but nothing worrisome in terms of disease recurrence and of course she continues to have problems with hot flashes.  Medications: I have reviewed the patient's current medications.   Current Outpatient Prescriptions  Medication Sig Dispense Refill  . ALPRAZolam (XANAX) 0.25 MG tablet Take 1 tablet (0.25 mg total) by mouth 3 (three) times daily as needed for anxiety.  24 tablet  0  . bisoprolol-hydrochlorothiazide (ZIAC) 5-6.25 MG per tablet TAKE 1 TABLET BY MOUTH EVERY DAY  90 tablet  0  . furosemide (LASIX) 20 MG tablet 1 po as directed for swelling  20 tablet  0  . potassium chloride (K-DUR) 10 MEQ tablet Take 10 mEq by mouth daily.        . Vilazodone HCl (VIIBRYD) 40 MG TABS Take 1 tablet by mouth 1 day or 1 dose.  30 tablet  0     Objective: Vital signs in last 24 hours: BP 147/86  Pulse 71  Temp 98.5 F (36.9 C)  Ht 5\' 5"  (1.651 m)  Wt 215 lb 4.8 oz (97.659 kg)  BMI 35.83 kg/m2   Physical Exam:    Sclerae unicteric  Oropharynx clear  No peripheral adenopathy  Lungs clear -- no rales or rhonchi  Heart regular rate and rhythm  Abdomen benign  MSK no focal spinal  tenderness, no peripheral edema  Neuro nonfocal  Breast exam: Right breast no suspicious masses left breast status post lumpectomy and radiation no evidence of local recurrence  Lab Results:   BMET    Component Value Date/Time   NA 139 09/16/2011 1614   K 3.7 09/16/2011 1614   CL 103 09/16/2011 1614   CO2 28 09/16/2011 1614   GLUCOSE 100* 09/16/2011 1614   BUN 13 09/16/2011 1614   CREATININE 0.83 09/16/2011 1614   CALCIUM 10.3 09/16/2011 1614   GFRNONAA 70.57 09/08/2010 0831   GFRAA 84 12/03/2008 0000     CMP     Component Value Date/Time   NA 139 09/16/2011 1614   K 3.7 09/16/2011 1614   CL 103 09/16/2011 1614   CO2 28 09/16/2011 1614   GLUCOSE 100* 09/16/2011 1614   BUN 13 09/16/2011 1614   CREATININE 0.83 09/16/2011 1614   CALCIUM 10.3 09/16/2011 1614   PROT 6.9 09/16/2011 1614   ALBUMIN 3.5 09/16/2011 1614   AST 34 09/16/2011 1614   ALT 49* 09/16/2011 1614   ALKPHOS 104 09/16/2011 1614   BILITOT 0.2* 09/16/2011 1614   GFRNONAA 70.57 09/08/2010 0831   GFRAA 84 12/03/2008 0000    CBC    Component Value Date/Time   WBC 4.8 06/24/2011 1113  RBC 4.78 06/24/2011 1113   HGB 14.2 09/16/2011 1614   HGB 13.3 06/24/2011 1113   HCT 41.4 09/16/2011 1614   HCT 39.7 06/24/2011 1113   PLT 190 09/16/2011 1614   PLT 205.0 06/24/2011 1113   MCV 85.3 09/16/2011 1614   MCV 83.1 06/24/2011 1113   MCH 27.1 09/09/2010 1556   MCHC 33.4 06/24/2011 1113   RDW 15.9* 06/24/2011 1113   LYMPHSABS 1.9 06/24/2011 1113   MONOABS 0.5 06/24/2011 1113   EOSABS 0.1 09/16/2011 1614   EOSABS 0.1 06/24/2011 1113   BASOSABS 0.0 09/16/2011 1614   BASOSABS 0.0 06/24/2011 1113        Studies/Results: Mammography March of this year was unremarkable  Assessment:  55 year old [place] woman status post left lumpectomy January of 2008 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 She is also concerned regarding continuing weight gain today we talked about exercise but it is difficult considering all the driving she is doing of for her to find a time to walk for 30-45 minutes daily  Amy Orozco C 09/27/2011

## 2011-10-03 ENCOUNTER — Encounter: Payer: 59 | Admitting: Vascular Surgery

## 2011-11-05 ENCOUNTER — Other Ambulatory Visit: Payer: Self-pay | Admitting: Internal Medicine

## 2011-11-25 ENCOUNTER — Ambulatory Visit: Payer: 59 | Admitting: Family Medicine

## 2011-11-25 ENCOUNTER — Telehealth: Payer: Self-pay | Admitting: *Deleted

## 2011-11-25 NOTE — Telephone Encounter (Signed)
Pt calls complaining of an abnormal headache worse than any she has ever had.  Advise ER visit ASAP, due to possibility of a possible hemorrhage  in her brain. Pt agrees with plan.

## 2011-11-28 NOTE — Telephone Encounter (Signed)
Dr Fry aware 

## 2011-12-29 ENCOUNTER — Other Ambulatory Visit: Payer: Self-pay | Admitting: Oncology

## 2011-12-29 DIAGNOSIS — Z853 Personal history of malignant neoplasm of breast: Secondary | ICD-10-CM

## 2012-01-03 ENCOUNTER — Ambulatory Visit (INDEPENDENT_AMBULATORY_CARE_PROVIDER_SITE_OTHER): Payer: 59 | Admitting: Internal Medicine

## 2012-01-03 ENCOUNTER — Encounter: Payer: Self-pay | Admitting: Internal Medicine

## 2012-01-03 VITALS — BP 140/90 | HR 60 | Wt 213.0 lb

## 2012-01-03 DIAGNOSIS — R41840 Attention and concentration deficit: Secondary | ICD-10-CM

## 2012-01-03 DIAGNOSIS — E669 Obesity, unspecified: Secondary | ICD-10-CM

## 2012-01-03 DIAGNOSIS — I10 Essential (primary) hypertension: Secondary | ICD-10-CM

## 2012-01-03 DIAGNOSIS — F4323 Adjustment disorder with mixed anxiety and depressed mood: Secondary | ICD-10-CM

## 2012-01-03 MED ORDER — BISOPROLOL-HYDROCHLOROTHIAZIDE 5-6.25 MG PO TABS: 1.0000 | ORAL_TABLET | Freq: Every day | ORAL | Status: DC

## 2012-01-03 MED ORDER — ALPRAZOLAM 0.25 MG PO TABS: 0.2500 mg | ORAL_TABLET | Freq: Three times a day (TID) | ORAL | Status: AC | PRN

## 2012-01-03 NOTE — Progress Notes (Signed)
  Subjective:    Patient ID: Amy Orozco, female    DOB: 05-Nov-1956, 56 y.o.   MRN: [id number]  HPI Patient comes in today for follow up of  multiple medical problems.  Job was eliminated   . Has  Seen gyne   And had pap smear.  And  Suggested looking into bariatric surgery if would be helpful . ? If has add concentration deficit problem. Someone could try adderall.   Stopped the vybrid in    More agitated. On it.   hasn't seen psych yet looking for someone in network.  Ht meds no se   Asks for enablex  And other samples.  Review of Systems Neg cp sob  Suicidal syncope bleeding   Past history family history social history reviewed in the electronic medical record.     Objective:   Physical Exam WDWN in nad heent grossly normal  Chest:  Clear to A&P without wheezes rales or rhonchi CV:  S1-S2 no gallops or murmurs peripheral perfusion is normal NEURO: oriented x 3 CN 3-12 appear intact. l. Gait WNL.  Grossly non focal. No tremor or abnormal movement. Oriented x 3. Normal cognition, attention, speech. Not anxious or depressed appearing   Good eye contact .     Assessment & Plan:  HT  Needs monitor  Consider inc med dose but pt want to do lifestyle intervention healthy eating and exercise . Instead for now .  So will refill today Mood still hasn't seen Behavioral specialist   And encouraged again or this as has se of a number o meds  ? About add adhd  uncertain dx  advise psych consult as before  Many mood issues could cause her sx . explained this again .  namaes given would facilitate referral  If needed.  Obesity   Disc strategies     Total visit > 50% spent counseling and coordinating care

## 2012-01-03 NOTE — Patient Instructions (Addendum)
monitor your blood pressure readings    To make sure if lower than 140/90 . weight loss can help.  Still thiing see psychiatry to evaluated for mood adhd and best med possiblity.  Dr Dub Mikes; Dr Nolen Mu Check in to  bariatric surgery also  For weight control     The Basics of Weight Loss  Decrease the Energy in and Increase the energy out  :   This is a Scientist, product/process development and hasn't been proven incorrect yet  no matter what any one selling a product or program says.  Decrease    calories you eat and drink , including alcohol. And sweet tea. Anything that goes in the mouth counts.   Increase   exercise or   Any physical movement , Walking is  A great exercise.  (optimize) sleep .  The trick and complexity  is in implementation in  our daily routines and to overcome internal urges that tell us we are hungry when we really are tired ,moody etc.  Medications side effects, sleep deprivation, hidden sugars in foods increase our cravings.  Eating only once a day  decreases our resting " Energy Out" and is associated  with maintaining the  obesity state.  Exercise helps burn energy in but also helps metabolic resting rate and many other functions that help [place] stay healthy.  Sleep deprivation tells [place] we are hungry when our body really needs sleep.   Some of [place] have a genetics that, once we are obese have a much harder time implementing the program . But weight loss is still based on the same principles.  Bypass surgery helps but doesn't not cure obesity . The good effects  can be overcome and weight gain reoccur.  If the person doesn't continue controlling the  Energy in energy out equation. I have seen this in patients years after the initial weight loss .   Weight loss programs are just ways of implementing above.  Medications for weight loss  have been a disappointment with some high risk effects including cost  and take your mental energy away from the task at hand. They can be helpful in some  people but rarely give long term success without above(  the hard work)  For this reason I rarely prescribe them. RIsk seems  higher than benefit.  Supplements don't make you lose weight.Save your money ; buy healthy  lower calorie fruits and veges with your money and eat them instead. I have seen patients succeed at any age  But slow and steady wins the race . The biggest loser  Amy Orozco needs to continue changes for a LIFETIME.   Be accountable and honest with yourself. Almost every one underestimates their energy intake( what they eat and drink )  From 10-30 %. So if not successful  Look again.   Eating  A few M &Ms require walking the length of a football field to burn this up!  Most adults need much fewer calories to live than we realize . Food adds on tv are ridiculously deceptive about what is a portion size .  Avoid triggers  Don't reward with food. Avoid habit eating mindfulness helps. Break the association of eating with socialization .Fnd activities not centered around food. (like going to lunch with a friend)   Roe Coombs t go out to eat  When you are very hungry. Don't get caught up in the all or nothing " If you cant do it perfectly don't do it at all"  mindset.   (If you were drowning  Wouldn't you tread water until you had more energy to swim to shore?)   Simple sugars and carbs make you hungrier in a few hours .   Add protein to your snack/ meal . A 90 calorie( non fried) egg does more to decrease appetie than a piece of white toast with jam  with about the same calories or more

## 2012-01-07 ENCOUNTER — Encounter: Payer: Self-pay | Admitting: Internal Medicine

## 2012-01-07 DIAGNOSIS — R41840 Attention and concentration deficit: Secondary | ICD-10-CM | POA: Insufficient documentation

## 2012-01-22 ENCOUNTER — Emergency Department (HOSPITAL_COMMUNITY)
Admission: EM | Admit: 2012-01-22 | Discharge: 2012-01-22 | Disposition: A | Discharge status: Home / Self Care | Payer: 59 | Source: Home / Self Care | Attending: Emergency Medicine | Admitting: Emergency Medicine

## 2012-01-22 ENCOUNTER — Encounter (HOSPITAL_COMMUNITY): Payer: Self-pay | Admitting: *Deleted

## 2012-01-22 DIAGNOSIS — M542 Cervicalgia: Secondary | ICD-10-CM

## 2012-01-22 MED ORDER — DEXAMETHASONE SODIUM PHOSPHATE 10 MG/ML IJ SOLN
10.0000 mg | Freq: Once | INTRAMUSCULAR | Status: AC
  Administered 2012-01-22: 10 mg via INTRAMUSCULAR

## 2012-01-22 MED ORDER — TRAMADOL HCL 50 MG PO TABS: 100.0000 mg | ORAL_TABLET | Freq: Three times a day (TID) | ORAL | Status: AC | PRN

## 2012-01-22 MED ORDER — DEXAMETHASONE SODIUM PHOSPHATE 10 MG/ML IJ SOLN
INTRAMUSCULAR | Status: AC
  Filled 2012-01-22: qty 1

## 2012-01-22 MED ORDER — KETOROLAC TROMETHAMINE 30 MG/ML IJ SOLN
30.0000 mg | Freq: Once | INTRAMUSCULAR | Status: AC
  Administered 2012-01-22: 30 mg via INTRAMUSCULAR

## 2012-01-22 MED ORDER — KETOROLAC TROMETHAMINE 30 MG/ML IJ SOLN
INTRAMUSCULAR | Status: AC
  Filled 2012-01-22: qty 1

## 2012-01-22 NOTE — ED Notes (Signed)
CO INTERMITTENT, THROBBING PAIN IN RIGHT NECK X 1 DAY, STATES SHE HAS HAD THIS BEFORE A FEW YRS AGO, DENIES N/V, STATES SHE HAD DIARRHEA ON SAT

## 2012-01-22 NOTE — Discharge Instructions (Signed)

## 2012-01-22 NOTE — ED Provider Notes (Signed)
Chief Complaint  Patient presents with  . Neck Pain    History of Present Illness:   Amy Orozco is a 56 year old female who has had recurring episodes of neck pain going on for years. The current episode has been going on since Saturday. She denies any injury. Episodes may occur in either the right or the left side and there throbbing without radiation they can last for days at a time. She also describes is a sharp pain. They're associated with nausea and lack of appetite. She has had some photophobia but no phonophobia. No visual complaints. She feels chilled and weak but no fever. She's never been seen by Dr. for these pains before. She also had a three-day history of nasal congestion, sneezing, and rhinorrhea. She's had a two-day history of loose stools which are orange yellow in color. She denies any blood in the stool. No abdominal pain, or vomiting. She denies any neurological symptoms such as paresthesias, weakness, difficulty with speech or ambulation. Her vision is normal.  Review of Systems:  Other than noted above, the patient denies any of the following symptoms: Constitutional:  No fever, chills, or sweats. ENT:  No nasal congestion, sore throat, pain on swallowing, or oral ulcerations or lesions.  No hoarseness or change in voice. Neck:  No swelling, or adenopathy.  Full ROM without pain. Cardiac:  No chest pain, tightness, pressure, or syncope. Respiratory:  No cough, wheezing, or dyspnea. M-S:  No joint pain, muscle pain, or back problems. Neuro:  No muscle weakness, numbness or paresthesias. Skin:  No rash.  PMFSH:  Past medical history, family history, social history, meds, and allergies were reviewed.  Physical Exam:   Vital signs:  BP 105/74  Pulse 88  Temp(Src) 98.4 F (36.9 C) (Oral)  Resp 22  SpO2 99% General:  Alert, oriented and in no distress. Eye:  PERRL, full EOMs. ENT:  Pharynx clear, no oral lesions. Neck:  The neck was completely nontender and have full  range of motion with no pain. There was no adenopathy or mass. Lungs:  No respiratory distress.  Breath sounds clear and equal bilaterally.  No wheezes, rales or rhonchi. Heart:  Regular rhythm.  No gallops, murmers, or rubs. Ext:  No upper extremity edema, pulses full.  Full ROM of joints with no joint or muscle pain to palpation. Neuro:  Alert and oriented times 3.  No focal muscle weakness.  DTRs symmetric.  Sensation intact to light touch. Skin: Clear, warm and dry.  No rash.  Good capillary refill.  Course in Urgent Care Center:   She was given Toradol 30 mg IM and Decadron 10 mg IM. Patient tolerated these meds well without any immediate side effects.   Assessment:   Diagnoses that have been ruled out:  None  Diagnoses that are still under consideration:  None  Final diagnoses:  Neck pain - this might be a migraine equivalent or cranial neuritis.     Plan:   1.  The following meds were prescribed:   New Prescriptions   TRAMADOL (ULTRAM) 50 MG TABLET    Take 2 tablets (100 mg total) by mouth every 8 (eight) hours as needed for pain.   2.  The patient was instructed in symptomatic care and handouts were given. 3.  The patient was told to return if becoming worse in any way, if no better in 3 or 4 days, and given some red flag symptoms that would indicate earlier return.  Follow up:  The  patient was told to follow up with her primary care physician, Dr. Fabian Sharp, in one week.     Reuben Likes, MD 01/22/12 1455

## 2012-01-27 ENCOUNTER — Ambulatory Visit (INDEPENDENT_AMBULATORY_CARE_PROVIDER_SITE_OTHER): Payer: 59 | Admitting: Internal Medicine

## 2012-01-27 ENCOUNTER — Encounter: Payer: Self-pay | Admitting: Internal Medicine

## 2012-01-27 VITALS — BP 110/70 | HR 80 | Wt 215.0 lb

## 2012-01-27 DIAGNOSIS — Z853 Personal history of malignant neoplasm of breast: Secondary | ICD-10-CM

## 2012-01-27 DIAGNOSIS — G43809 Other migraine, not intractable, without status migrainosus: Secondary | ICD-10-CM | POA: Insufficient documentation

## 2012-01-27 DIAGNOSIS — M542 Cervicalgia: Secondary | ICD-10-CM

## 2012-01-27 NOTE — Progress Notes (Signed)
  Subjective:    Patient ID: Amy Orozco, female    DOB: Jul 11, 1956, 56 y.o.   MRN: [id number]  HPI Patient comes in today for SDA  problem evaluation. After having been seen in the urgent care on March 10.  She had what seemed like a viral infection with a couple days of loose stools and then some coughing sneezing that has since gotten better.  However she  today on the weekend with a posterior occipital pole versus neck pain with throbbing ache neck pain felt like a tooth ache. She had no vomiting but some photophobia she was given Toradol and Decadron and tramadol to use as needed.  Currently her pain is better but she was told to followup.  No vision changes or falling or other neurologic signs at this time.  Since her last visit she has been placed on low dose Synthroid but I do not have a copy of her last TSH levels have been borderline in the past. She denies any tachycardia.  On further questioning she does have a remote history since a teenager of neck or occipital crescendo throbbing pain episodes that are severe and usually needs some kind of intervention. In the past she's been given antibiotics for possible ear infection. She has some milder versions of this for which he takes ibuprofen and it goes away.  She thinks she has had 6 severe episodes of this neck pain.  Unsure why things are getting worse now.  Review of Systems Negative for chest pain shortness of breath fever  Past history family history social history reviewed in the electronic medical record.     Objective:   Physical Exam Well-developed well-nourished in no acute distress looks pretty well today HEENT is grossly normal neck supple without masses or adenopathy Neck without bruit Chest:  Clear to A&P without wheezes rales or rhonchi CV:  S1-S2 no gallops or murmurs peripheral perfusion is normal Neurologic is grossly nonfocal EOMs are full. Gait is within normal limits and Romberg is negative  Reviewed  urgent care note.    Assessment & Plan:  Episodic neck pain  actually seems like a posterior headache and possible migraine variant or cluster headache.  Today her exam is nonrevealing and nonfocal.  Toes of the question of accurate diagnosis and how to treat and/or prevent I. advised and she agrees to getting a neurologic consult about the advisability of any further workup or monitoring. At this point in time I would have her take 2 Aleve if it recurs and contact us or other medical care.   She is now on low-dose thyroid supplement I cannot find her last TSH done.  I don't think that  new medication is related at this time.  Blood pressure stable  History of breast cancer   stable

## 2012-01-27 NOTE — Patient Instructions (Addendum)
This neck pain appears to be some type of migraine variant but I am unsure about this.  We will do a referral consult to neurology to get an opinion about diagnosis and treatment as needed.  Someone will contact you about an appointment.  In the meantime I agree with taking  something like to leave at the onset of the neck pain. Call if in the meantime of something is atypical or different.  The current trigger could have been an infection but things such as weather changes change in sleep pattern caffeine  Hormones  and diet could trigger if this is a migraine variant.

## 2012-02-08 ENCOUNTER — Ambulatory Visit
Admission: RE | Admit: 2012-02-08 | Discharge: 2012-02-08 | Disposition: A | Discharge status: Home / Self Care | Payer: 59 | Source: Ambulatory Visit | Attending: Oncology | Admitting: Oncology

## 2012-02-08 DIAGNOSIS — Z853 Personal history of malignant neoplasm of breast: Secondary | ICD-10-CM

## 2012-02-14 ENCOUNTER — Encounter: Payer: Self-pay | Admitting: Neurology

## 2012-04-06 ENCOUNTER — Ambulatory Visit: Payer: 59 | Admitting: Neurology

## 2012-05-22 ENCOUNTER — Other Ambulatory Visit: Payer: 59 | Admitting: Lab

## 2012-05-22 ENCOUNTER — Other Ambulatory Visit: Payer: Self-pay | Admitting: *Deleted

## 2012-05-22 DIAGNOSIS — C50919 Malignant neoplasm of unspecified site of unspecified female breast: Secondary | ICD-10-CM

## 2012-05-22 NOTE — Progress Notes (Signed)
Message left by pt stating need to reschedule lab before MD appt due to conflict in schedule today.   Request transferred to scheduling.

## 2012-05-24 ENCOUNTER — Other Ambulatory Visit (HOSPITAL_BASED_OUTPATIENT_CLINIC_OR_DEPARTMENT_OTHER): Payer: 59 | Admitting: Lab

## 2012-05-24 DIAGNOSIS — C50919 Malignant neoplasm of unspecified site of unspecified female breast: Secondary | ICD-10-CM

## 2012-05-24 LAB — COMPREHENSIVE METABOLIC PANEL
ALT: 41 U/L — ABNORMAL HIGH (ref 0–35)
AST: 31 U/L (ref 0–37)
BUN: 11 mg/dL (ref 6–23)
CO2: 28 mEq/L (ref 19–32)
Calcium: 10.8 mg/dL — ABNORMAL HIGH (ref 8.4–10.5)
Creatinine, Ser: 0.89 mg/dL (ref 0.50–1.10)
Total Bilirubin: 0.7 mg/dL (ref 0.3–1.2)

## 2012-05-24 LAB — CBC WITH DIFFERENTIAL/PLATELET
BASO%: 0.6 % (ref 0.0–2.0)
Basophils Absolute: 0 10*3/uL (ref 0.0–0.1)
EOS%: 1.6 % (ref 0.0–7.0)
HCT: 44.5 % (ref 34.8–46.6)
HGB: 15.6 g/dL (ref 11.6–15.9)
LYMPH%: 41.5 % (ref 14.0–49.7)
MCH: 30.5 pg (ref 25.1–34.0)
MCHC: 34.9 g/dL (ref 31.5–36.0)
NEUT%: 48.8 % (ref 38.4–76.8)
Platelets: 184 10*3/uL (ref 145–400)

## 2012-05-24 LAB — CANCER ANTIGEN 27.29: CA 27.29: 24 U/mL (ref 0–39)

## 2012-05-29 ENCOUNTER — Ambulatory Visit: Payer: 59 | Admitting: Oncology

## 2012-05-30 ENCOUNTER — Telehealth: Payer: Self-pay | Admitting: Oncology

## 2012-05-30 NOTE — Telephone Encounter (Signed)
Pt called to cancel her July appt and r/s.

## 2012-06-07 ENCOUNTER — Ambulatory Visit: Payer: 59 | Admitting: Oncology

## 2012-06-14 ENCOUNTER — Ambulatory Visit (HOSPITAL_BASED_OUTPATIENT_CLINIC_OR_DEPARTMENT_OTHER): Payer: 59 | Admitting: Oncology

## 2012-06-14 VITALS — BP 131/84 | HR 69 | Temp 97.9°F | Ht 65.9 in | Wt 193.4 lb

## 2012-06-14 DIAGNOSIS — D0590 Unspecified type of carcinoma in situ of unspecified breast: Secondary | ICD-10-CM

## 2012-06-14 DIAGNOSIS — C50919 Malignant neoplasm of unspecified site of unspecified female breast: Secondary | ICD-10-CM

## 2012-06-14 NOTE — Progress Notes (Signed)
ID: Amy Orozco   DOB: 07/19/1956  MR#: [id number]  NWG#:[id number]  HISTORY OF PRESENT ILLNESS: Amy Orozco had bilateral screening mammograms October 30, 2006 at the [facility], showing new calcifications in the left breast.  She was called back for additional views on November 10, 2006, and a left diagnostic mammogram on that date showed a 7 mm cluster of heterogeneous calcifications at the left breast, which appeared to have increased since prior studies.  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  With this information, the patient proceeded to bilateral breast MRIs, which were performed December 04, 2006 at [facility].  This showed in the left breast lower outer quadrant, an area of enhancement measuring up to 1.8 cm, associated with clip artifact and correlating with the mammographic calcifications.  There were no other areas of suspicious enhancement.  No enlarged internal mammary lymph nodes.    At about this time the patient's father had a stroke, and that is why she chose to have the surgery under Dr. Benard Rink in [place].  This was performed December 08, 2006 under needle localization, and the final pathology there ([id number] [facility] showed focal intraductal papillomatosis and microcalcification, with some hemaciderin deposition consistent with the previous biopsy site.  Dr. Arlis Porta felt the findings represented some residual sclerosing papillary lesion, which now had been completely excised.  The sample was reviewed by Dr. Vaughan Browner at [facility], and Dr. 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 Her subsequent history is as detailed below  INTERVAL HISTORY: Amy Orozco returns today for routine followup of her noninvasive breast cancer. The interval history  is significant for having found a new job, and it keeps [name] busy she doesn't have time to regularly exercise.  REVIEW OF SYSTEMS: She is taking appetite suppressants and has managed to lose about 25 pounds as a result. She's also changed her diet somewhat. Because she is eating less fiber, she is a bit constipated. Otherwise a detailed review of systems today was entirely noncontributory  PAST MEDICAL HISTORY: Past Medical History  Diagnosis Date  . Hypertension   . HX: breast cancer     hx 2008 in situ left on mri every 2 yr  . LFTs abnormal 2010    MRI fatty liver and hemangioma  . History of abnormal Pap smear     remote and had a cone biopsy  . Hx of colonic polyps   . Allergic rhinitis     PAST SURGICAL HISTORY: Past Surgical History  Procedure Date  . Cervical cone biopsy 1988    abnormal pap  . Breast lumpectomy     left breast    FAMILY HISTORY Family History  Problem Relation Age of Onset  . Asthma Mother   . Heart disease Mother   . Other Mother     Degentive Joint Disease,  circulatory problems phelbitis, no DVT  . Thyroid disease Mother   . Osteoarthritis Mother   . Heart attack Father   . Stroke Father   . Rheumatologic disease      aunt  The patient's father had a stroke but he survived with some visual field loss as his major deficit. The patient's mother has some problems with heart disease.  The patient has one brother and one sister.  The only breast cancer in the  family is one of the father's four sisters, and this was clearly postmenopausal.  There is no history of ovarian cancer in the family.   GYNECOLOGIC HISTORY: She is GX P0.  Change of life approximately age 15.  She took hormones for approximately 10 years.    SOCIAL HISTORY: She works as a Engineer, civil (consulting) (occupational health)..  She married at age 46, and divorced at age 13.  Lives by herself with two cats.  Her friend, Annice Pih, who is in [employer] for [employer]inda [employer]k is present today.  The  patient is a Control and instrumentation engineer.     ADVANCED DIRECTIVES:  HEALTH MAINTENANCE: History  Substance Use Topics  . Smoking status: Former Games developer  . Smokeless tobacco: Never Used  . Alcohol Use: No     Colonoscopy:  PAP:  Bone density:  Lipid panel:  Allergies  Allergen Reactions  . Codeine     REACTION: Nausea  . Penicillins     REACTION: rash  . Sulfonamide Derivatives     REACTION: rash  . Vilazodone Hcl     Made anxiety worse    Current Outpatient Prescriptions  Medication Sig Dispense Refill  . bisoprolol-hydrochlorothiazide (ZIAC) 5-6.25 MG per tablet Take 1 tablet by mouth daily.  90 tablet  3  . levothyroxine (SYNTHROID, LEVOTHROID) 50 MCG tablet Take 50 mcg by mouth daily.      Marland Kitchen ALPRAZolam (XANAX) 0.25 MG tablet Take 1 tablet (0.25 mg total) by mouth 3 (three) times daily as needed for anxiety.  24 tablet  0  . furosemide (LASIX) 20 MG tablet 1 po as directed for swelling  20 tablet  0  . potassium chloride (K-DUR) 10 MEQ tablet Take 10 mEq by mouth daily.          OBJECTIVE: Middle-aged white woman who appears well Filed Vitals:   06/14/12 1354  BP: 131/84  Pulse: 69  Temp: 97.9 F (36.6 C)     Body mass index is 31.31 kg/(m^2).    ECOG FS: 0  Sclerae unicteric Oropharynx clear No cervical or supraclavicular adenopathy Lungs no rales or rhonchi Heart regular rate and rhythm Abd benign MSK no focal spinal tenderness, no peripheral edema Neuro: nonfocal Breasts: The right breast is unremarkable. The left breast status post lumpectomy and radiation. There is no evidence of local recurrence. The left axilla is clear.  LAB RESULTS: Lab Results  Component Value Date   WBC 4.7 05/24/2012   NEUTROABS 2.3 05/24/2012   HGB 15.6 05/24/2012   HCT 44.5 05/24/2012   MCV 87.5 05/24/2012   PLT 184 05/24/2012      Chemistry      Component Value Date/Time   NA 138 05/24/2012 1448   K 3.3* 05/24/2012 1448   CL 101 05/24/2012 1448   CO2 28 05/24/2012 1448   BUN 11 05/24/2012  1448   CREATININE 0.89 05/24/2012 1448      Component Value Date/Time   CALCIUM 10.8* 05/24/2012 1448   ALKPHOS 82 05/24/2012 1448   AST 31 05/24/2012 1448   ALT 41* 05/24/2012 1448   BILITOT 0.7 05/24/2012 1448       Lab Results  Component Value Date   LABCA2 24 05/24/2012    No components found with this basename: [id number]    No results found for this basename: INR:1;PROTIME:1 in the last 168 hours  Urinalysis    Component Value Date/Time   COLORURINE yellow 09/14/2010 1344   APPEARANCEUR Clear 09/14/2010 1344   LABSPEC <1.005 09/14/2010 1344  PHURINE 5.0 09/14/2010 1344   HGBUR negative 09/14/2010 1344   BILIRUBINUR n 06/30/2011 1152   BILIRUBINUR negative 09/14/2010 1344   UROBILINOGEN 0.2 06/30/2011 1152   UROBILINOGEN 0.2 09/14/2010 1344   NITRITE n 06/30/2011 1152   NITRITE negative 09/14/2010 1344   LEUKOCYTESUR trace 06/30/2011 1152    STUDIES: Most recent mammogram March 2013 was unremarkable.  ASSESSMENT: 56 y.o. 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  Status post radiation therapy, which was completed in June 2008 at which time she was started no tamoxifen 20 mg daily.  Continued on tamoxifen for a little over one year, discontinued in October 2009 due to intolerance.    PLAN: Sherese is doing fine, and at this point I am are comfortable releasing her to her primary care physician's care. All she will need is yearly mammography and yearly physician breast exam. She knows to call for any problems that may develop that we can be helpful with, but as of now no further appointments are being made for her here.   Kyleena Scheirer C    06/14/2012

## 2012-06-15 ENCOUNTER — Telehealth: Payer: Self-pay | Admitting: Oncology

## 2012-06-15 NOTE — Telephone Encounter (Signed)
Letter sent to PT.  And Dr.Panosh Kotvan office

## 2012-11-13 ENCOUNTER — Ambulatory Visit (INDEPENDENT_AMBULATORY_CARE_PROVIDER_SITE_OTHER): Payer: 59 | Admitting: Internal Medicine

## 2012-11-13 ENCOUNTER — Encounter: Payer: Self-pay | Admitting: Internal Medicine

## 2012-11-13 VITALS — BP 146/102 | HR 68 | Temp 98.0°F | Wt 187.0 lb

## 2012-11-13 DIAGNOSIS — R946 Abnormal results of thyroid function studies: Secondary | ICD-10-CM

## 2012-11-13 DIAGNOSIS — L650 Telogen effluvium: Secondary | ICD-10-CM

## 2012-11-13 DIAGNOSIS — R252 Cramp and spasm: Secondary | ICD-10-CM | POA: Insufficient documentation

## 2012-11-13 DIAGNOSIS — I10 Essential (primary) hypertension: Secondary | ICD-10-CM

## 2012-11-13 LAB — BASIC METABOLIC PANEL
CO2: 31 mEq/L (ref 19–32)
Chloride: 104 mEq/L (ref 96–112)
Creatinine, Ser: 0.9 mg/dL (ref 0.4–1.2)
Potassium: 4.5 mEq/L (ref 3.5–5.1)

## 2012-11-13 LAB — T3, FREE: T3, Free: 2.4 pg/mL (ref 2.3–4.2)

## 2012-11-13 LAB — MAGNESIUM: Magnesium: 2.2 mg/dL (ref 1.5–2.5)

## 2012-11-13 LAB — T4, FREE: Free T4: 0.93 ng/dL (ref 0.60–1.60)

## 2012-11-13 LAB — TSH: TSH: 1.13 u[IU]/mL (ref 0.35–5.50)

## 2012-11-13 LAB — TESTOSTERONE: Testosterone: 10.16 ng/dL (ref 10.00–70.00)

## 2012-11-13 NOTE — Patient Instructions (Addendum)
This may be telogen effluviian  And related to the beginning of the thyroid medication.   Will notify you  of labs when available. And then follow up.    Most often improves   With time after another 3 months  Check bp readings at home to make sure they are in  Range.     dont back slide  .    Make sure  We get the labs from Dr Algie Coffer.

## 2012-11-13 NOTE — Progress Notes (Signed)
Chief Complaint  Patient presents with  . Follow-up    Has had some cramps and would like her potassium renewed.  Started taking Synthroid through her gynecologist.  She would like for you to manage this.   HPI: Patient comes in today for follow up of  multiple medical problems.  Was Considering lap band   Counseling  And then trying  Phentermine.    Monthly check  Just  finished 4 month trial with some weight loss . And now  Feels controlled  With this.    Has gained about 5 pounds again with holidays. Is aware   Put on thyroid  meds cause  T3? Or tsh  was elevated .  Began august   And  Considering as cause of weight problems .  hair started falling out in handful about a month ago    After this.    And now tapering synthroid.     Med. wants [place] to recheck this and mange any problems  Has stopped th med concern causing the shedding .   No other se . Feels well and lover  new job  Happy with this  .   ROS: See pertinent positives and negatives per HPI. No cp sob   Having leg cramps off nad on  Asks for K check   Edema is less since weight loss  Past Medical History  Diagnosis Date  . Hypertension   . HX: breast cancer     hx 2008 in situ left on mri every 2 yr  . LFTs abnormal 2010    MRI fatty liver and hemangioma  . History of abnormal Pap smear     remote and had a cone biopsy  . Hx of colonic polyps   . Allergic rhinitis     Family History  Problem Relation Age of Onset  . Asthma Mother   . Heart disease Mother   . Other Mother     Degentive Joint Disease,  circulatory problems phelbitis, no DVT  . Thyroid disease Mother   . Osteoarthritis Mother   . Heart attack Father   . Stroke Father   . Rheumatologic disease      aunt    History   Social History  . Marital Status: Single    Spouse Name: N/A    Number of Children: N/A  . Years of Education: N/A   Social History Main Topics  . Smoking status: Former Games developer  . Smokeless tobacco: Never Used  . Alcohol Use:  No  . Drug Use: No  . Sexually Active: None   Other Topics Concern  . 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 bisoprolol-hydrochlorothiazide (ZIAC) 5-6.25 MG per tablet Take 1 tablet by mouth daily.  90 tablet  3  . levothyroxine (SYNTHROID, LEVOTHROID) 50 MCG tablet Take 50 mcg by mouth daily.      Marland Kitchen ALPRAZolam (XANAX) 0.25 MG tablet Take 1 tablet (0.25 mg total) by mouth 3 (three) times daily as needed for anxiety.  24 tablet  0  . potassium chloride (K-DUR) 10 MEQ tablet Take 10 mEq by mouth daily.        . [DISCONTINUED] furosemide (LASIX) 20 MG tablet 1 po as  directed for swelling  20 tablet  0    EXAM:  BP 146/102  Pulse 68  Temp 98 F (36.7 C) (Oral)  Wt 187 lb (84.823 kg)  There is no height on file to calculate BMI. Wt Readings from Last 3 Encounters:  11/13/12 187 lb (84.823 kg)  06/14/12 193 lb 6.4 oz (87.726 kg)  01/27/12 215 lb (97.523 kg)    GENERAL: vitals reviewed and listed above, alert, oriented, appears well hydrated and in no acute distress  HEENT: atraumatic, conjunctiva  clear, no obvious abnormalities on inspection of external nose and ears OP : no lesion edema or exudate   NECK: no obvious masses on inspection palpation  No bruits   LUNGS: clear to auscultation bilaterally, no wheezes, rales or rhonchi, good air movement  CV: HRRR, no clubbing cyanosis or  peripheral edema nl cap refill tepeat bp 148/90 right arm  MS: moves all extremities without noticeable focal  abnormality SKIN SCALP no scaring  No alopecia although thinning upper forehead area . No rash with this .  PSYCH: pleasant and cooperative, no obvious depression or anxiety  ASSESSMENT AND PLAN:  Discussed the following  assessment and plan:  1. Telogen hair loss  Basic metabolic panel, TSH, T4, free, T3, free, Magnesium, Testosterone   prob from beginning synthroid  now is off expectant managment   2. HYPERTENSION  Basic metabolic panel, TSH, T4, free, T3, free, Magnesium, Testosterone   up today off phentermine follow up iof elevated at home  3. Thyroid function test abnormal  Basic metabolic panel, TSH, T4, free, T3, free, Magnesium, Testosterone   hx of some  will get records  check lab today off med for weeks   4. Leg cramp     hx of low K check level today  timing fits  Telogen from thyroid med  Weight loss predated the problem . Counseled. avoidweight regain  Off phentermine .   -Patient advised to return or notify health care team  immediately if symptoms worsen or persist or new concerns arise.  Patient Instructions  This may be telogen effluviian  And related to the beginning of the thyroid medication.   Will notify you  of labs when available. And then follow up.    Most often improves   With time after another 3 months  Check bp readings at home to make sure they are in  Range.     dont back slide  .    Make sure  We get the labs from Dr Algie Coffer.      Neta Mends. Panosh M.D. after pt left ;abs from her gyne  tsh was about 7 at one time but nl t3 t4  This was when begun on meds.  Will follow   At next blood draw will check antibody studies and follow .

## 2012-11-14 ENCOUNTER — Encounter: Payer: Self-pay | Admitting: Internal Medicine

## 2012-11-18 ENCOUNTER — Encounter: Payer: Self-pay | Admitting: Internal Medicine

## 2012-11-19 ENCOUNTER — Other Ambulatory Visit: Payer: Self-pay | Admitting: Family Medicine

## 2012-11-19 DIAGNOSIS — R7989 Other specified abnormal findings of blood chemistry: Secondary | ICD-10-CM

## 2013-01-14 ENCOUNTER — Other Ambulatory Visit: Payer: Self-pay | Admitting: Internal Medicine

## 2013-01-14 DIAGNOSIS — Z853 Personal history of malignant neoplasm of breast: Secondary | ICD-10-CM

## 2013-01-14 DIAGNOSIS — Z9889 Other specified postprocedural states: Secondary | ICD-10-CM

## 2013-02-15 ENCOUNTER — Ambulatory Visit
Admission: RE | Admit: 2013-02-15 | Discharge: 2013-02-15 | Disposition: A | Discharge status: Home / Self Care | Payer: 59 | Source: Ambulatory Visit | Attending: Internal Medicine | Admitting: Internal Medicine

## 2013-02-15 DIAGNOSIS — Z853 Personal history of malignant neoplasm of breast: Secondary | ICD-10-CM

## 2013-02-15 DIAGNOSIS — Z9889 Other specified postprocedural states: Secondary | ICD-10-CM

## 2013-02-27 ENCOUNTER — Other Ambulatory Visit: Payer: Self-pay | Admitting: Internal Medicine

## 2013-02-28 ENCOUNTER — Ambulatory Visit: Payer: 59 | Admitting: Internal Medicine

## 2013-03-13 ENCOUNTER — Other Ambulatory Visit (INDEPENDENT_AMBULATORY_CARE_PROVIDER_SITE_OTHER): Payer: 59

## 2013-03-13 DIAGNOSIS — R7989 Other specified abnormal findings of blood chemistry: Secondary | ICD-10-CM

## 2013-03-13 DIAGNOSIS — R6889 Other general symptoms and signs: Secondary | ICD-10-CM

## 2013-03-13 LAB — LIPID PANEL
LDL Cholesterol: 100 mg/dL — ABNORMAL HIGH (ref 0–99)
Total CHOL/HDL Ratio: 3
Triglycerides: 89 mg/dL (ref 0.0–149.0)

## 2013-03-13 LAB — TSH: TSH: 2.68 u[IU]/mL (ref 0.35–5.50)

## 2013-03-20 ENCOUNTER — Ambulatory Visit (INDEPENDENT_AMBULATORY_CARE_PROVIDER_SITE_OTHER): Payer: 59 | Admitting: Internal Medicine

## 2013-03-20 ENCOUNTER — Encounter: Payer: Self-pay | Admitting: Internal Medicine

## 2013-03-20 VITALS — BP 120/80 | HR 64 | Temp 97.8°F | Wt 189.0 lb

## 2013-03-20 DIAGNOSIS — R252 Cramp and spasm: Secondary | ICD-10-CM | POA: Insufficient documentation

## 2013-03-20 DIAGNOSIS — R946 Abnormal results of thyroid function studies: Secondary | ICD-10-CM

## 2013-03-20 DIAGNOSIS — L650 Telogen effluvium: Secondary | ICD-10-CM

## 2013-03-20 DIAGNOSIS — I10 Essential (primary) hypertension: Secondary | ICD-10-CM

## 2013-03-20 NOTE — Patient Instructions (Signed)
Ok to stay off of the thyroid med for now as we discussed Your levels are normal today. However you may be at risk to get  Thyroid dysfunction in the future but we can monitored for this .     Check tsh free t4 and thyroid antibodies   Bmp and  MAGNESIUM in 6 months  . Then  wellness visit  Ok   To take mag once a day.   Get records   From the last 2 years

## 2013-03-20 NOTE — Progress Notes (Signed)
Chief Complaint  Patient presents with  . Follow-up    HPI: Patient comes in today for follow up of  multiple medical problems.    Fu of thyroid management and bp  Off the   Med for 3-4 months     hairloss seems to be getting better and still losing weight . Using soim supplements    Leg cramps and finger cramps  bp has been ok.  Went off the potassium months ago?    Still workking co getting bought again  Breast cancer  On fu as a prn basis   ? When pap is due having mostly yearly.   Dr Algie Coffer however wants to swith over all care to one place  So we can do paps when due  ROS: See pertinent positives and negatives per HPI. No cp sob  Syncope depression  Past Medical History  Diagnosis Date  . Hypertension   . HX: breast cancer     hx 2008 in situ left on mri every 2 yr  . LFTs abnormal 2010    MRI fatty liver and hemangioma  . History of abnormal Pap smear     remote and had a cone biopsy  . Hx of colonic polyps   . Allergic rhinitis     Family History  Problem Relation Age of Onset  . Asthma Mother   . Heart disease Mother   . Other Mother     Degentive Joint Disease,  circulatory problems phelbitis, no DVT  . Thyroid disease Mother   . Osteoarthritis Mother   . Heart attack Father   . Stroke Father   . Rheumatologic disease      aunt    History   Social History  . Marital Status: Single    Spouse Name: N/A    Number of Children: N/A  . Years of Education: N/A   Social History Main Topics  . Smoking status: Former Games developer  . Smokeless tobacco: Never Used  . Alcohol Use: No  . Drug Use: No  . Sexually Active: None   Other Topics Concern  . 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 bisoprolol-hydrochlorothiazide (ZIAC) 5-6.25 MG per tablet TAKE 1 TABLET BY MOUTH DAILY.  90 tablet  0  . GARCINIA CAMBOGIA-CHROMIUM PO Take by mouth.      . Magnesium Chloride (SLOW-MAG PO) Take by mouth.      . Multiple Vitamins-Minerals (HAIR/SKIN/NAILS/BIOTIN PO) Take by mouth.      . potassium chloride (K-DUR) 10 MEQ tablet Take 10 mEq by mouth daily.        . [DISCONTINUED] levothyroxine (SYNTHROID, LEVOTHROID) 50 MCG tablet Take 50 mcg by mouth daily.       No facility-administered encounter medications on file as of 03/20/2013.    EXAM:  BP 120/80  Pulse 64  Temp(Src) 97.8 F (36.6 C) (Oral)  Wt 189 lb (85.73 kg)  BMI 30.59 kg/m2  Body mass index is 30.59 kg/(m^2).  GENERAL: vitals reviewed and listed above, alert, oriented, appears well hydrated and in no acute distress  HEENT: atraumatic, conjunctiva  clear, no obvious abnormalities on inspection of  external nose and ears    NECK: no obvious masses on inspection palpation  No edema  Scalp no scarring or patches  MS: moves all extremities without noticeable focal  abnormality  PSYCH: pleasant and cooperative, no obvious depression or anxiety Lab Results  Component Value Date   WBC 4.7 05/24/2012   HGB 15.6 05/24/2012   HCT 44.5 05/24/2012   PLT 184 05/24/2012   GLUCOSE 88 11/13/2012   CHOL 181 03/13/2013   TRIG 89.0 03/13/2013   HDL 63.60 03/13/2013   LDLCALC 100* 03/13/2013   ALT 41* 05/24/2012   AST 31 05/24/2012   NA 141 11/13/2012   K 4.5 11/13/2012   CL 104 11/13/2012   CREATININE 0.9 11/13/2012   BUN 14 11/13/2012   CO2 31 11/13/2012   TSH 2.68 03/13/2013    ASSESSMENT AND PLAN:  Discussed the following assessment and plan:  Thyroid function test abnormal - remot hx of in tsh and minor abd of tpo in past follow . - Plan: Multiple Vitamins-Minerals (HAIR/SKIN/NAILS/BIOTIN PO), Magnesium Chloride (SLOW-MAG PO), GARCINIA CAMBOGIA-CHROMIUM PO, Basic metabolic panel, Magnesium  HYPERTENSION  - controlled with med and weight loss - Plan: Multiple Vitamins-Minerals (HAIR/SKIN/NAILS/BIOTIN PO), Magnesium Chloride (SLOW-MAG PO), GARCINIA CAMBOGIA-CHROMIUM PO, Basic metabolic panel, Magnesium  Leg cramps - now off K?  ran out ok to add mg but check bmp and mg today and make further advice  - Plan: Multiple Vitamins-Minerals (HAIR/SKIN/NAILS/BIOTIN PO), Magnesium Chloride (SLOW-MAG PO), GARCINIA CAMBOGIA-CHROMIUM PO, Basic metabolic panel, Magnesium  Telogen hair loss - better off med 3 months hcm  May not be due for pap get 21 year records     tsh free t4 and tpo in 6 months and then CPX . -Patient advised to return or notify health care team  if symptoms worsen or persist or new concerns arise.  Patient Instructions  Ok to stay off of the thyroid med for now as we discussed Your levels are normal today. However you may be at risk to get  Thyroid dysfunction in the future but we can monitored for this .     Check tsh free t4 and thyroid antibodies   Bmp and  MAGNESIUM in 6 months  . Then  wellness visit  Ok   To take mag once a day.   Get records   From the last 2 years      Neta Mends. Adisen Bennion M.D.

## 2013-03-21 ENCOUNTER — Encounter: Payer: Self-pay | Admitting: Family Medicine

## 2013-03-21 LAB — MAGNESIUM: Magnesium: 2.2 mg/dL (ref 1.5–2.5)

## 2013-03-21 LAB — BASIC METABOLIC PANEL
CO2: 29 mEq/L (ref 19–32)
Chloride: 102 mEq/L (ref 96–112)
Creatinine, Ser: 0.9 mg/dL (ref 0.4–1.2)
Potassium: 4.1 mEq/L (ref 3.5–5.1)
Sodium: 138 mEq/L (ref 135–145)

## 2013-05-31 ENCOUNTER — Other Ambulatory Visit: Payer: Self-pay | Admitting: Internal Medicine

## 2013-06-03 ENCOUNTER — Telehealth: Payer: Self-pay | Admitting: Internal Medicine

## 2013-06-03 NOTE — Telephone Encounter (Signed)
Spoke to the pt pt.  She has had ankle swelling for the past several weeks.  Has a hx of leg swelling but is not having trouble with that at this time.  Was on lasix prn in the past.  Is requesting that again.  Has had some redness and heat on her ankles and up through her shins.  Last seen in May.  Please advise.  Thanks!!

## 2013-06-03 NOTE — Telephone Encounter (Signed)
Needs ov to assess  Plan based on message.

## 2013-06-03 NOTE — Telephone Encounter (Signed)
Pt made appt for 06/04/13 @ 11:15am

## 2013-06-03 NOTE — Telephone Encounter (Signed)
Caller: Amy Orozco/Patient; PCP: Berniece Andreas; CB# [phone]. 06/03/13 - Called Patient back per [facility] Call Back @ 2:30 pm on 06/03/13 and there was no answer at that time.

## 2013-06-04 ENCOUNTER — Ambulatory Visit (INDEPENDENT_AMBULATORY_CARE_PROVIDER_SITE_OTHER): Payer: 59 | Admitting: Internal Medicine

## 2013-06-04 ENCOUNTER — Encounter: Payer: Self-pay | Admitting: Internal Medicine

## 2013-06-04 VITALS — BP 150/86 | HR 70 | Temp 97.6°F | Wt 196.0 lb

## 2013-06-04 DIAGNOSIS — I10 Essential (primary) hypertension: Secondary | ICD-10-CM

## 2013-06-04 DIAGNOSIS — R609 Edema, unspecified: Secondary | ICD-10-CM

## 2013-06-04 MED ORDER — FUROSEMIDE 20 MG PO TABS: 20.0000 mg | ORAL_TABLET | Freq: Every day | ORAL | Status: AC

## 2013-06-04 MED ORDER — POTASSIUM CHLORIDE ER 10 MEQ PO TBCR: 10.0000 meq | EXTENDED_RELEASE_TABLET | Freq: Every day | ORAL | Status: AC

## 2013-06-04 NOTE — Patient Instructions (Signed)
Elevate legs    Can try lasix for  7- 10 days and restart the potassium.  With  Lasix   Advise   Compression stockings as we discuss  Consider seeing vascular consult for venous disease.  Losing weight and low salt diet also .

## 2013-06-04 NOTE — Progress Notes (Signed)
Chief Complaint  Patient presents with  . Ankle Swelling    redness and heat spreading up the legs.  . Edema    HPI: Patient comes in today for SDA for  new problem evaluation. Onset swelling left more than right leg edema  1-2 weeks  See phone note iniitally with some discomfort and now jsut swollen  No prolonged dependency  Hx of neg doppler in past  No cp sob bp has been good ( not on K for a while)  Has used prn lasix in the past but legs usually not this swollen for this ling.   ROS: See pertinent positives and negatives per HPI. No fever  Trauma .  Some weight gain  Trying to get weight off  Wonders if worse with the supplement plans to stop.   Past Medical History  Diagnosis Date  . Hypertension   . HX: breast cancer     hx 2008 in situ left on mri every 2 yr  . LFTs abnormal 2010    MRI fatty liver and hemangioma  . History of abnormal Pap smear     remote and had a cone biopsy  . Hx of colonic polyps   . Allergic rhinitis   . HYPERTENSION 12/03/2008    Qualifier: Diagnosis of  By: Lawernce Ion, CMA (AAMA), Bethann Berkshire     Family History  Problem Relation Age of Onset  . Asthma Mother   . Heart disease Mother   . Other Mother     Degentive Joint Disease,  circulatory problems phelbitis, no DVT  . Thyroid disease Mother   . Osteoarthritis Mother   . Heart attack Father   . Stroke Father   . Rheumatologic disease      aunt    History   Social History  . Marital Status: Single    Spouse Name: N/A    Number of Children: N/A  . Years of Education: N/A   Social History Main Topics  . Smoking status: Former Games developer  . Smokeless tobacco: Never Used  . Alcohol Use: No  . Drug Use: No  . Sexually Active: None   Other Topics Concern  . 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 bisoprolol-hydrochlorothiazide (ZIAC) 5-6.25 MG per tablet TAKE 1 TABLET BY MOUTH DAILY.  90 tablet  0  . GARCINIA CAMBOGIA-CHROMIUM PO Take by mouth.      . Magnesium Chloride (SLOW-MAG PO) Take by mouth.      . Multiple Vitamins-Minerals (HAIR/SKIN/NAILS/BIOTIN PO) Take by mouth.      . potassium chloride (K-DUR) 10 MEQ tablet Take 1 tablet (10 mEq total) by mouth daily.  30 tablet  2  . [DISCONTINUED] potassium chloride (K-DUR) 10 MEQ tablet Take 10 mEq by mouth daily.        . furosemide (LASIX) 20 MG tablet Take 1 tablet (20 mg total) by mouth daily. For 7- 10 days or as directed  30 tablet  0   No facility-administered encounter medications on file as of 06/04/2013.    EXAM:  BP 150/86  Pulse 70  Temp(Src) 97.6 F (36.4 C) (Oral)  Wt 196  lb (88.905 kg)  BMI 31.73 kg/m2  SpO2 98%  Body mass index is 31.73 kg/(m^2).  GENERAL: vitals reviewed and listed above, alert, oriented, appears well hydrated and in no acute distress  HEENT: atraumatic, conjunctiva  clear, no obvious abnormalities on inspection of external nose and ears NECK: no obvious masses on inspection palpation  LUNGS: clear to auscultation bilaterally, no wheezes, rales or rhonchi, good air movement CV: HRRR, no clubbing cyanosis  nl cap refill  Short sem usb no radiation 2 + edema ;left more than right some redness  Some varicose beins but no chords oar streaking neg homans  Pulses nl .  MS: moves all extremities without noticeable focal  abnormality PSYCH: pleasant and cooperative, no obvious depression or anxiety Lab Results  Component Value Date   WBC 4.7 05/24/2012   HGB 15.6 05/24/2012   HCT 44.5 05/24/2012   PLT 184 05/24/2012   GLUCOSE 77 03/20/2013   CHOL 181 03/13/2013   TRIG 89.0 03/13/2013   HDL 63.60 03/13/2013   LDLCALC 100* 03/13/2013   ALT 41* 05/24/2012   AST 31 05/24/2012   NA 138 03/20/2013   K 4.1 03/20/2013    CL 102 03/20/2013   CREATININE 0.9 03/20/2013   BUN 15 03/20/2013   CO2 29 03/20/2013   TSH 2.68 03/13/2013    ASSESSMENT AND PLAN:  Discussed the following assessment and plan:  Edema - dependent.  poss venous   dis managment   no obv cv pulm cause   HYPERTENSION  bp up today  Monitor had been good    Ok to do short coiurse of lasxix with potassium supplementation   7- 10 days.  If remaining on meds  Then need BMp and fu .  rx for compression stockings thigh high 30 - 40 written  Consider vascular  Consult   -Patient advised to return or notify health care team  if symptoms worsen or persist or new concerns arise.  Patient Instructions  Elevate legs    Can try lasix for  7- 10 days and restart the potassium.  With  Lasix   Advise   Compression stockings as we discuss  Consider seeing vascular consult for venous disease.  Losing weight and low salt diet also .    Neta Mends. Jaivian Battaglini M.D.

## 2013-07-04 ENCOUNTER — Encounter: Payer: Self-pay | Admitting: Internal Medicine

## 2013-07-04 ENCOUNTER — Ambulatory Visit (INDEPENDENT_AMBULATORY_CARE_PROVIDER_SITE_OTHER): Payer: 59 | Admitting: Internal Medicine

## 2013-07-04 VITALS — BP 130/90 | Temp 98.1°F | Wt 200.0 lb

## 2013-07-04 DIAGNOSIS — I10 Essential (primary) hypertension: Secondary | ICD-10-CM

## 2013-07-04 DIAGNOSIS — R21 Rash and other nonspecific skin eruption: Secondary | ICD-10-CM

## 2013-07-04 MED ORDER — NYSTATIN-TRIAMCINOLONE 100000-0.1 UNIT/GM-% EX OINT: TOPICAL_OINTMENT | Freq: Two times a day (BID) | CUTANEOUS | Status: AC

## 2013-07-04 NOTE — Patient Instructions (Addendum)
Call or return to clinic prn if these symptoms worsen or fail to improve as anticipated.

## 2013-07-04 NOTE — Progress Notes (Signed)
Subjective:    Patient ID: Amy Orozco, female    DOB: 1956-10-23, 57 y.o.   MRN: [id number]  HPI  57 year old patient who presents with a one-day history of a pruritic  rash beneath her right breast. She has had shingles in the past and was concerned about a possible recurrence. This rash feels much different from her  prior episode of mild zoster.  She does have a house cat  Past Medical History  Diagnosis Date  . Hypertension   . HX: breast cancer     hx 2008 in situ left on mri every 2 yr  . LFTs abnormal 2010    MRI fatty liver and hemangioma  . History of abnormal Pap smear     remote and had a cone biopsy  . Hx of colonic polyps   . Allergic rhinitis   . HYPERTENSION 12/03/2008    Qualifier: Diagnosis of  By: Lawernce Ion, CMA (AAMA), Bethann Berkshire     History   Social History  . Marital Status: Single    Spouse Name: N/A    Number of Children: N/A  . Years of Education: N/A   Occupational History  . Not on file.   Social History Main Topics  . Smoking status: Former Games developer  . Smokeless tobacco: Never Used  . Alcohol Use: No  . Drug Use: No  . Sexual Activity: Not on file   Other Topics Concern  . 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 Cervical cone biopsy  1988    abnormal pap  . Breast lumpectomy      left breast    Family History  Problem Relation Age of Onset  . Asthma Mother   . Heart disease Mother   . Other Mother     Degentive Joint Disease,  circulatory problems phelbitis, no DVT  . Thyroid disease Mother   . Osteoarthritis Mother   . Heart attack Father   . Stroke Father   . Rheumatologic disease      aunt    Allergies  Allergen Reactions  . Codeine     REACTION: Nausea  .  Penicillins     REACTION: rash  . Sulfonamide Derivatives     REACTION: rash  . Vilazodone Hcl     Made anxiety worse    Current Outpatient Prescriptions on File Prior to Visit  Medication Sig Dispense Refill  . bisoprolol-hydrochlorothiazide (ZIAC) 5-6.25 MG per tablet TAKE 1 TABLET BY MOUTH DAILY.  90 tablet  0  . furosemide (LASIX) 20 MG tablet Take 1 tablet (20 mg total) by mouth daily. For 7- 10 days or as directed  30 tablet  0  . GARCINIA CAMBOGIA-CHROMIUM PO Take by mouth.      . Magnesium Chloride (SLOW-MAG PO) Take by mouth.      . Multiple Vitamins-Minerals (HAIR/SKIN/NAILS/BIOTIN PO) Take by mouth.      . potassium chloride (K-DUR) 10 MEQ tablet Take 1 tablet (10 mEq total) by mouth daily.  30 tablet  2   No current facility-administered medications on file prior to visit.    BP 130/90  Temp(Src) 98.1 F (36.7 C) (Oral)  Wt 200 lb (90.719 kg)  BMI 32.37 kg/m2     Review of Systems  Skin: Positive for rash.       Objective:   Physical Exam  Skin:  4-5 scattered erythematous papules are noted in the intertriginous region beneath the right breast          Assessment & Plan:   Nonspecific dermatitis. Will treat with triamcinolone/nystatin cream. Will call if unimproved

## 2013-07-09 ENCOUNTER — Other Ambulatory Visit: Payer: Self-pay | Admitting: Internal Medicine

## 2013-07-10 NOTE — Telephone Encounter (Signed)
Should the patient continue this medication?  Prescribed as short term.

## 2013-07-11 NOTE — Telephone Encounter (Signed)
No t a long term medication   No refill at this time

## 2013-08-06 ENCOUNTER — Other Ambulatory Visit: Payer: Self-pay | Admitting: Family Medicine

## 2013-08-06 MED ORDER — BISOPROLOL-HYDROCHLOROTHIAZIDE 5-6.25 MG PO TABS: ORAL_TABLET | ORAL | Status: AC

## 2013-08-13 ENCOUNTER — Ambulatory Visit (INDEPENDENT_AMBULATORY_CARE_PROVIDER_SITE_OTHER): Payer: 59 | Admitting: Family Medicine

## 2013-08-13 DIAGNOSIS — Z23 Encounter for immunization: Secondary | ICD-10-CM

## 2013-09-24 ENCOUNTER — Encounter: Payer: 59 | Admitting: Internal Medicine

## 2014-02-21 ENCOUNTER — Other Ambulatory Visit: Payer: Self-pay | Admitting: Family Medicine

## 2014-02-21 DIAGNOSIS — Z853 Personal history of malignant neoplasm of breast: Secondary | ICD-10-CM

## 2014-02-21 DIAGNOSIS — Z9889 Other specified postprocedural states: Secondary | ICD-10-CM

## 2014-03-04 ENCOUNTER — Ambulatory Visit
Admission: RE | Admit: 2014-03-04 | Discharge: 2014-03-04 | Disposition: A | Discharge status: Home / Self Care | Payer: BC Managed Care – PPO | Source: Ambulatory Visit | Attending: Family Medicine | Admitting: Family Medicine

## 2014-03-04 ENCOUNTER — Other Ambulatory Visit: Payer: Self-pay | Admitting: Family Medicine

## 2014-03-04 DIAGNOSIS — Z853 Personal history of malignant neoplasm of breast: Secondary | ICD-10-CM

## 2014-03-04 DIAGNOSIS — Z9889 Other specified postprocedural states: Secondary | ICD-10-CM

## 2017-08-04 ENCOUNTER — Encounter: Payer: Self-pay | Admitting: Internal Medicine

## 2017-10-13 ENCOUNTER — Encounter: Payer: Self-pay | Admitting: Internal Medicine
